# Patient Record
Sex: Male | Born: 1985 | Race: Black or African American | Hispanic: No | Marital: Married | Smoking: Current every day smoker
Health system: Southern US, Community
[De-identification: ages and names within clinical notes are randomized; demographics above are authoritative.]

## PROBLEM LIST (undated history)

## (undated) DIAGNOSIS — I10 Essential (primary) hypertension: Secondary | ICD-10-CM

## (undated) DIAGNOSIS — K649 Unspecified hemorrhoids: Secondary | ICD-10-CM

---
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
## 2008-09-21 ENCOUNTER — Ambulatory Visit: Payer: Self-pay | Admitting: Infectious Disease

## 2008-09-21 ENCOUNTER — Encounter (INDEPENDENT_AMBULATORY_CARE_PROVIDER_SITE_OTHER): Payer: Self-pay | Admitting: Internal Medicine

## 2008-09-21 DIAGNOSIS — J45909 Unspecified asthma, uncomplicated: Secondary | ICD-10-CM | POA: Insufficient documentation

## 2008-09-21 DIAGNOSIS — J309 Allergic rhinitis, unspecified: Secondary | ICD-10-CM | POA: Insufficient documentation

## 2008-11-05 ENCOUNTER — Ambulatory Visit: Payer: Self-pay | Admitting: Internal Medicine

## 2008-11-05 ENCOUNTER — Encounter: Payer: Self-pay | Admitting: Internal Medicine

## 2008-11-05 DIAGNOSIS — K029 Dental caries, unspecified: Secondary | ICD-10-CM | POA: Insufficient documentation

## 2008-11-05 LAB — CONVERTED CEMR LAB
Basophils Absolute: 0 10*3/uL (ref 0.0–0.1)
Basophils Relative: 0 % (ref 0–1)
Eosinophils Absolute: 0.4 10*3/uL (ref 0.0–0.7)
Eosinophils Relative: 5 % (ref 0–5)
HCT: 46.4 % (ref 39.0–52.0)
Hemoglobin: 15.9 g/dL (ref 13.0–17.0)
Lymphocytes Relative: 27 % (ref 12–46)
Lymphs Abs: 2.1 10*3/uL (ref 0.7–4.0)
MCHC: 34.3 g/dL (ref 30.0–36.0)
MCV: 91.3 fL (ref 78.0–100.0)
Monocytes Absolute: 0.9 10*3/uL (ref 0.1–1.0)
Monocytes Relative: 11 % (ref 3–12)
Neutro Abs: 4.5 10*3/uL (ref 1.7–7.7)
Neutrophils Relative %: 57 % (ref 43–77)
Platelets: 247 10*3/uL (ref 150–400)
RBC: 5.08 M/uL (ref 4.22–5.81)
RDW: 13.2 % (ref 11.5–15.5)
WBC: 7.9 10*3/uL (ref 4.0–10.5)

## 2011-04-07 ENCOUNTER — Emergency Department (HOSPITAL_COMMUNITY)
Admission: EM | Admit: 2011-04-07 | Discharge: 2011-04-07 | Disposition: A | Discharge status: Home / Self Care | Payer: Self-pay | Attending: Emergency Medicine | Admitting: Emergency Medicine

## 2011-04-07 ENCOUNTER — Encounter: Payer: Self-pay | Admitting: *Deleted

## 2011-04-07 DIAGNOSIS — K029 Dental caries, unspecified: Secondary | ICD-10-CM | POA: Insufficient documentation

## 2011-04-07 DIAGNOSIS — K089 Disorder of teeth and supporting structures, unspecified: Secondary | ICD-10-CM | POA: Insufficient documentation

## 2011-04-07 MED ORDER — PENICILLIN V POTASSIUM 500 MG PO TABS: 500.0000 mg | ORAL_TABLET | Freq: Four times a day (QID) | ORAL | Status: AC

## 2011-04-07 MED ORDER — HYDROCODONE-ACETAMINOPHEN 5-500 MG PO TABS: 1.0000 | ORAL_TABLET | Freq: Four times a day (QID) | ORAL | Status: AC | PRN

## 2011-04-07 NOTE — ED Notes (Signed)
Pt w/ lft lower toothache that began on Thursday, has medicated at home w/ ibuprofen and naproxen w/ minimal relief.

## 2011-04-07 NOTE — ED Provider Notes (Signed)
History     CSN: [id number] Arrival date & time: 04/07/2011  5:50 AM   First MD Initiated Contact with Patient 04/07/11 0557      No chief complaint on file.   (Consider location/radiation/quality/duration/timing/severity/associated sxs/prior treatment) Patient is a 25 y.o. male presenting with tooth pain. The history is provided by the patient. No language interpreter was used.  Dental PainThe primary symptoms include dental injury. Primary symptoms do not include mouth pain, oral bleeding, headaches, shortness of breath, sore throat, angioedema or cough. The symptoms began more than 1 week ago. The symptoms are worsening. The symptoms are new. The symptoms occur constantly.  Additional symptoms include: dental sensitivity to temperature. Additional symptoms do not include: gum swelling, gum tenderness, purulent gums, trismus, jaw pain, facial swelling, trouble swallowing, pain with swallowing, excessive salivation, dry mouth, taste disturbance, smell disturbance, drooling, ear pain, hearing loss, nosebleeds and swollen glands. Medical issues do not include: cancer.    No past medical history on file.  No past surgical history on file.  No family history on file.  History  Substance Use Topics  . Smoking status: Not on file  . Smokeless tobacco: Not on file  . Alcohol Use: Not on file      Review of Systems  HENT: Negative for hearing loss, ear pain, nosebleeds, sore throat, facial swelling, drooling and trouble swallowing.   Respiratory: Negative for cough and shortness of breath.   Cardiovascular: Negative for chest pain.  Gastrointestinal: Negative for abdominal distention.  Genitourinary: Negative for difficulty urinating.  Musculoskeletal: Negative.   Neurological: Negative for headaches.  Hematological: Negative.   Psychiatric/Behavioral: Negative.     Allergies  Review of patient's allergies indicates no known allergies.  Home Medications   Current Outpatient  Rx  Name Route Sig Dispense Refill  . IBUPROFEN 200 MG PO TABS Oral Take 200-800 mg by mouth every 6 (six) hours as needed. pain     . HYDROCODONE-ACETAMINOPHEN 5-500 MG PO TABS Oral Take 1 tablet by mouth every 6 (six) hours as needed for pain. 10 tablet 0  . PENICILLIN V POTASSIUM 500 MG PO TABS Oral Take 1 tablet (500 mg total) by mouth 4 (four) times daily. 40 tablet 0    There were no vitals taken for this visit.  Physical Exam  Constitutional: He is oriented to person, place, and time. He appears well-developed and well-nourished. No distress.  HENT:  Head: Normocephalic and atraumatic.  Mouth/Throat: Oropharynx is clear and moist.    Eyes: EOM are normal.  Neck: Normal range of motion. Neck supple.  Cardiovascular: Normal rate and regular rhythm.   Pulmonary/Chest: Effort normal and breath sounds normal.  Abdominal: Soft. Bowel sounds are normal.  Neurological: He is alert and oriented to person, place, and time.  Skin: Skin is warm and dry.  Psychiatric: He has a normal mood and affect.    ED Course  Procedures (including critical care time)  Labs Reviewed - No data to display No results found.   1. DENTAL CARIES       MDM  Take all antibiotics, follow up with dentristry for removal       Dolora Ridgely K Kavitha Lansdale-Rasch, MD 04/07/11 [phone]

## 2011-04-07 NOTE — ED Notes (Signed)
Rx given x1 D/c instructions reviewed w/ pt - pt denies any further questions or concerns at present.   

## 2011-04-07 NOTE — ED Notes (Signed)
Pt reports rt lower toothache that began on Thursday, denies fever.

## 2013-02-12 ENCOUNTER — Emergency Department (HOSPITAL_COMMUNITY)
Admission: EM | Admit: 2013-02-12 | Discharge: 2013-02-12 | Disposition: A | Discharge status: Home / Self Care | Payer: Self-pay | Attending: Emergency Medicine | Admitting: Emergency Medicine

## 2013-02-12 ENCOUNTER — Encounter (HOSPITAL_COMMUNITY): Payer: Self-pay | Admitting: *Deleted

## 2013-02-12 DIAGNOSIS — K0889 Other specified disorders of teeth and supporting structures: Secondary | ICD-10-CM

## 2013-02-12 DIAGNOSIS — F17200 Nicotine dependence, unspecified, uncomplicated: Secondary | ICD-10-CM | POA: Insufficient documentation

## 2013-02-12 DIAGNOSIS — K089 Disorder of teeth and supporting structures, unspecified: Secondary | ICD-10-CM | POA: Insufficient documentation

## 2013-02-12 DIAGNOSIS — Z79899 Other long term (current) drug therapy: Secondary | ICD-10-CM | POA: Insufficient documentation

## 2013-02-12 DIAGNOSIS — K029 Dental caries, unspecified: Secondary | ICD-10-CM | POA: Insufficient documentation

## 2013-02-12 DIAGNOSIS — K0381 Cracked tooth: Secondary | ICD-10-CM | POA: Insufficient documentation

## 2013-02-12 MED ORDER — PENICILLIN V POTASSIUM 250 MG PO TABS
500.0000 mg | ORAL_TABLET | Freq: Once | ORAL | Status: AC
  Administered 2013-02-12: 500 mg via ORAL
  Filled 2013-02-12: qty 2

## 2013-02-12 MED ORDER — TRAMADOL HCL 50 MG PO TABS
50.0000 mg | ORAL_TABLET | Freq: Once | ORAL | Status: AC
  Administered 2013-02-12: 50 mg via ORAL
  Filled 2013-02-12: qty 1

## 2013-02-12 MED ORDER — TRAMADOL HCL 50 MG PO TABS: 50.0000 mg | ORAL_TABLET | Freq: Four times a day (QID) | ORAL | Status: DC | PRN

## 2013-02-12 MED ORDER — PENICILLIN V POTASSIUM 500 MG PO TABS: 500.0000 mg | ORAL_TABLET | Freq: Four times a day (QID) | ORAL | Status: DC

## 2013-02-12 NOTE — ED Provider Notes (Signed)
CSN: [id number]     Arrival date & time 02/12/13  1804 History   First MD Initiated Contact with Patient 02/12/13 1853     Chief Complaint  Patient presents with  . Dental Pain   (Consider location/radiation/quality/duration/timing/severity/associated sxs/prior Treatment) HPI Comments: The patient is a 27 year old otherwise healthy male who presents with dental pain that started gradually one month ago. The dental pain is severe, constant and progressively worsening. The pain is aching and located in right upper jaw. The pain does not radiate. Eating makes the pain worse. Nothing makes the pain better. The patient has not tried anything for pain. No associated symptoms. Patient denies headache, neck pain/stiffness, fever, NVD, edema, sore throat, throat swelling, wheezing, SOB, chest pain, abdominal pain.      History reviewed. No pertinent past medical history. History reviewed. No pertinent past surgical history. History reviewed. No pertinent family history. History  Substance Use Topics  . Smoking status: Current Every Day Smoker -- 0.50 packs/day  . Smokeless tobacco: Not on file  . Alcohol Use: No    Review of Systems  HENT: Positive for dental problem.   All other systems reviewed and are negative.    Allergies  Review of patient's allergies indicates no known allergies.  Home Medications   Current Outpatient Rx  Name  Route  Sig  Dispense  Refill  . albuterol (PROVENTIL HFA;VENTOLIN HFA) 108 (90 BASE) MCG/ACT inhaler   Inhalation   Inhale 2 puffs into the lungs every 6 (six) hours as needed for wheezing.         Marland Kitchen ibuprofen (ADVIL,MOTRIN) 200 MG tablet   Oral   Take 200-800 mg by mouth every 6 (six) hours as needed. pain           BP 149/65  Pulse 70  Temp(Src) 98.4 F (36.9 C) (Oral)  Resp 16  SpO2 97% Physical Exam  Nursing note and vitals reviewed. Constitutional: He is oriented to person, place, and time. He appears well-developed and  well-nourished. No distress.  HENT:  Head: Normocephalic and atraumatic.  Poor dentition. Right upper molar tender to percussion. Multiple molars decayed and cracked.   Eyes: Conjunctivae are normal.  Neck: Normal range of motion.  Cardiovascular: Normal rate and regular rhythm.  Exam reveals no gallop and no friction rub.   No murmur heard. Pulmonary/Chest: Effort normal and breath sounds normal. He has no wheezes. He has no rales. He exhibits no tenderness.  Musculoskeletal: Normal range of motion.  Neurological: He is alert and oriented to person, place, and time. Coordination normal.  Speech is goal-oriented. Moves limbs without ataxia.   Skin: Skin is warm and dry.  Psychiatric: He has a normal mood and affect. His behavior is normal.    ED Course  Procedures (including critical care time) Labs Review Labs Reviewed - No data to display Imaging Review No results found.  MDM   1. Pain, dental     7:49 PM Patient given Veetid and Tramadol. Patient will have prescriptions for both. Patient instructed to follow up with [facility] in [place]. Vitals stable and patient afebrile.     Emilia Beck, PA-C 02/12/13 2003

## 2013-02-12 NOTE — ED Notes (Signed)
Reports right upper dental pain for several days. Airway intact. 

## 2013-02-19 NOTE — ED Provider Notes (Signed)
Medical screening examination/treatment/procedure(s) were performed by non-physician practitioner and as supervising physician I was immediately available for consultation/collaboration.   Roney Marion, MD 02/19/13 1626

## 2014-10-10 ENCOUNTER — Emergency Department (HOSPITAL_COMMUNITY)
Admission: EM | Admit: 2014-10-10 | Discharge: 2014-10-10 | Disposition: A | Discharge status: Home / Self Care | Payer: Self-pay | Attending: Emergency Medicine | Admitting: Emergency Medicine

## 2014-10-10 ENCOUNTER — Encounter (HOSPITAL_COMMUNITY): Payer: Self-pay | Admitting: Emergency Medicine

## 2014-10-10 DIAGNOSIS — R5383 Other fatigue: Secondary | ICD-10-CM | POA: Insufficient documentation

## 2014-10-10 DIAGNOSIS — R252 Cramp and spasm: Secondary | ICD-10-CM

## 2014-10-10 DIAGNOSIS — Z792 Long term (current) use of antibiotics: Secondary | ICD-10-CM | POA: Insufficient documentation

## 2014-10-10 DIAGNOSIS — Z720 Tobacco use: Secondary | ICD-10-CM | POA: Insufficient documentation

## 2014-10-10 DIAGNOSIS — M62838 Other muscle spasm: Secondary | ICD-10-CM | POA: Insufficient documentation

## 2014-10-10 LAB — I-STAT CHEM 8, ED
BUN: 17 mg/dL (ref 6–20)
CHLORIDE: 102 mmol/L (ref 101–111)
CREATININE: 1 mg/dL (ref 0.61–1.24)
Calcium, Ion: 1.24 mmol/L — ABNORMAL HIGH (ref 1.12–1.23)
Glucose, Bld: 90 mg/dL (ref 65–99)
HEMATOCRIT: 50 % (ref 39.0–52.0)
Hemoglobin: 17 g/dL (ref 13.0–17.0)
POTASSIUM: 3.6 mmol/L (ref 3.5–5.1)
Sodium: 141 mmol/L (ref 135–145)
TCO2: 24 mmol/L (ref 0–100)

## 2014-10-10 MED ORDER — CYCLOBENZAPRINE HCL 10 MG PO TABS: 10.0000 mg | ORAL_TABLET | Freq: Three times a day (TID) | ORAL | Status: DC | PRN

## 2014-10-10 NOTE — ED Notes (Signed)
Pt c/o B/L leg spasms x 4 days, pt also reports spasms noted under his eyes. When he walks his feet begin to cramp up too.

## 2014-10-10 NOTE — Discharge Instructions (Signed)
Take naprosyn as directed for inflammation and pain and flexeril for muscle relaxation at night. Do not drive or operate machinery with muscle relaxation use. Use heat to the areas of pain, no more than 20 minutes at a time every hour. Stretch your muscles out when having the spasms. Follow up with [facility] in 1 week for recheck of symptoms. Return to ER for emergent changing or worsening of symptoms.     Leg Cramps Leg cramps that occur during exercise can be caused by poor circulation or dehydration. However, muscle cramps that occur at rest or during the night are usually not due to any serious medical problem. Heat cramps may cause muscle spasms during hot weather.  CAUSES There is no clear cause for muscle cramps. However, dehydration may be a factor for those who do not drink enough fluids and those who exercise in the heat. Imbalances in the level of sodium, potassium, calcium or magnesium in the muscle tissue may also be a factor. Some medications, such as water pills (diuretics), may cause loss of chemicals that the body needs (like sodium and potassium) and cause muscle cramps. TREATMENT   Make sure your diet has enough fluids and essential minerals for the muscle to work normally.  Avoid strenuous exercise for several days if you have been having frequent leg cramps.  Stretch and massage the cramped muscle for several minutes.  Some medicines may be helpful in some patients with night cramps. Only take over-the-counter or prescription medicines as directed by your caregiver. SEEK IMMEDIATE MEDICAL CARE IF:   Your leg cramps become worse.  Your foot becomes cold, numb, or blue. Document Released: 06/14/2004 Document Revised: 07/30/2011 Document Reviewed: 06/01/2008 [facility] Patient Information 2015 [place], [place]. This information is not intended to replace advice given to you by your health care provider. Make sure you discuss any questions you have with your health  care provider.  Muscle Cramps and Spasms Muscle cramps and spasms occur when a muscle or muscles tighten and you have no control over this tightening (involuntary muscle contraction). They are a common problem and can develop in any muscle. The most common place is in the calf muscles of the leg. Both muscle cramps and muscle spasms are involuntary muscle contractions, but they also have differences:   Muscle cramps are sporadic and painful. They may last a few seconds to a quarter of an hour. Muscle cramps are often more forceful and last longer than muscle spasms.  Muscle spasms may or may not be painful. They may also last just a few seconds or much longer. CAUSES  It is uncommon for cramps or spasms to be due to a serious underlying problem. In many cases, the cause of cramps or spasms is unknown. Some common causes are:   Overexertion.   Overuse from repetitive motions (doing the same thing over and over).   Remaining in a certain position for a long period of time.   Improper preparation, form, or technique while performing a sport or activity.   Dehydration.   Injury.   Side effects of some medicines.   Abnormally low levels of the salts and ions in your blood (electrolytes), especially potassium and calcium. This could happen if you are taking water pills (diuretics) or you are pregnant.  Some underlying medical problems can make it more likely to develop cramps or spasms. These include, but are not limited to:   Diabetes.   Parkinson disease.   Hormone disorders, such as  thyroid problems.   Alcohol abuse.   Diseases specific to muscles, joints, and bones.   Blood vessel disease where not enough blood is getting to the muscles.  HOME CARE INSTRUCTIONS   Stay well hydrated. Drink enough water and fluids to keep your urine clear or pale yellow.  It may be helpful to massage, stretch, and relax the affected muscle.  For tight or tense muscles, use a  warm towel, heating pad, or hot shower water directed to the affected area.  If you are sore or have pain after a cramp or spasm, applying ice to the affected area may relieve discomfort.  Put ice in a plastic bag.  Place a towel between your skin and the bag.  Leave the ice on for 15-20 minutes, 03-04 times a day.  Medicines used to treat a known cause of cramps or spasms may help reduce their frequency or severity. Only take over-the-counter or prescription medicines as directed by your caregiver. SEEK MEDICAL CARE IF:  Your cramps or spasms get more severe, more frequent, or do not improve over time.  MAKE SURE YOU:   Understand these instructions.  Will watch your condition.  Will get help right away if you are not doing well or get worse. Document Released: 10/27/2001 Document Revised: 09/01/2012 Document Reviewed: 04/23/2012 [facility] Patient Information 2015 [place]. This information is not intended to replace advice given to you by your health care provider. Make sure you discuss any questions you have with your health care provider.

## 2014-10-10 NOTE — ED Notes (Signed)
Declined W/C at D/C and was escorted to lobby by RN. 

## 2014-10-10 NOTE — ED Provider Notes (Signed)
CSN: [id number]     Arrival date & time 10/10/14  1559 History  This chart was scribed for non-physician practitioner working, [employer], PA-C, with Glynn Octave, MD, by Modena Jansky, ED Scribe. This patient was seen in room TR10C/TR10C and the patient's care was started at 4:51 PM.   Chief Complaint  Patient presents with  . Spasms   Patient is a 29 y.o. male presenting with general illness. The history is provided by the patient. No language interpreter was used.  Illness Location:  BLE, feet, and eyelids Quality:  Cramping Severity:  Moderate Onset quality:  Gradual Duration:  5 days Timing:  Intermittent Progression:  Unchanged Chronicity:  New Context:  Spasms  Relieved by:  None  Worsened by:  None Ineffective treatments:  None  Associated symptoms: fatigue   Associated symptoms: no abdominal pain, no chest pain, no cough, no fever, no myalgias, no nausea, no rhinorrhea, no shortness of breath, no sore throat and no vomiting     HPI Comments: Clayton Rodgers is a 29 y.o. healthy male who presents to the Emergency Department complaining of intermittent moderate spasms that started 4 days ago. He reports that he has been having spasms and cramps to his bilateral feet, bilateral lower legs, and his lower eyelids. He states that he has been having overall fatigue. He reports no modifying factors. He denies any lightheadedness, leg pain, leg swelling, abdominal pain, nausea, vomiting, chest pain, SOB, sore throat, cough, URI symptoms, tingling, weakness, numbness, or back pain. No recent trauma. No claudication.  History reviewed. No pertinent past medical history. History reviewed. No pertinent past surgical history. No family history on file. History  Substance Use Topics  . Smoking status: Current Every Day Smoker -- 0.50 packs/day  . Smokeless tobacco: Not on file  . Alcohol Use: No    Review of Systems  Constitutional: Positive for fatigue. Negative for  fever and chills.  HENT: Negative for rhinorrhea and sore throat.   Respiratory: Negative for cough and shortness of breath.   Cardiovascular: Negative for chest pain and leg swelling.  Gastrointestinal: Negative for nausea, vomiting and abdominal pain.  Genitourinary: Negative for dysuria and hematuria.  Musculoskeletal: Negative for myalgias, back pain, joint swelling and arthralgias.       Leg cramps  Skin: Negative for color change.  Allergic/Immunologic: Negative for immunocompromised state.  Neurological: Negative for weakness, light-headedness and numbness.   10 Systems reviewed and all are negative for acute change except as noted in the HPI.  Allergies  Review of patient's allergies indicates no known allergies.  Home Medications   Prior to Admission medications   Medication Sig Start Date End Date Taking? Authorizing Provider  albuterol (PROVENTIL HFA;VENTOLIN HFA) 108 (90 BASE) MCG/ACT inhaler Inhale 2 puffs into the lungs every 6 (six) hours as needed for wheezing.    Historical Provider, MD  ibuprofen (ADVIL,MOTRIN) 200 MG tablet Take 200-800 mg by mouth every 6 (six) hours as needed. pain     Historical Provider, MD  penicillin v potassium (VEETID) 500 MG tablet Take 1 tablet (500 mg total) by mouth 4 (four) times daily. 02/12/13   Kaitlyn Szekalski, PA-C  traMADol (ULTRAM) 50 MG tablet Take 1 tablet (50 mg total) by mouth every 6 (six) hours as needed for pain. 02/12/13   Kaitlyn Szekalski, PA-C   BP 142/82 mmHg  Pulse 68  Temp(Src) 98 F (36.7 C) (Oral)  Resp 18  Ht  (1.676 m)  Wt 221 lb 6.4  oz (100.426 kg)  BMI 35.75 kg/m2  SpO2 98% Physical Exam  Constitutional: He is oriented to person, place, and time. Vital signs are normal. He appears well-developed and well-nourished.  Non-toxic appearance. No distress.  Afebrile, nontoxic, NAD  HENT:  Head: Normocephalic and atraumatic.  Mouth/Throat: Mucous membranes are normal.  Eyes: Conjunctivae and EOM are  normal. Right eye exhibits no discharge. Left eye exhibits no discharge.  Neck: Normal range of motion. Neck supple.  Cardiovascular: Normal rate and intact distal pulses.   Pulmonary/Chest: Effort normal. No respiratory distress.  Abdominal: Normal appearance. He exhibits no distension.  Musculoskeletal: Normal range of motion.       Right lower leg: Normal.       Left lower leg: Normal.  MAE x4 Strength and sensation grossly intact Distal pulses intact No pedal edema, neg homan's bilaterally No focal calf or leg TTP, no swelling or deformity, ROM intact in al joints, gait steady.   Neurological: He is alert and oriented to person, place, and time. He has normal strength. No sensory deficit.  Skin: Skin is warm, dry and intact. No rash noted. No erythema.  No erythema or warmth of legs  Psychiatric: He has a normal mood and affect.  Nursing note and vitals reviewed.   ED Course  Procedures (including critical care time) DIAGNOSTIC STUDIES: Oxygen Saturation is 98% on RA, normal by my interpretation.    COORDINATION OF CARE: 4:55 PM- Pt advised of plan for treatment which includes labs and pt agrees.  Labs Review Labs Reviewed  I-STAT CHEM 8, ED - Abnormal; Notable for the following:    Calcium, Ion 1.24 (*)    All other components within normal limits    Imaging Review No results found.   EKG Interpretation None      MDM   Final diagnoses:  Spasm     29 y.o. male here with intermittent leg cramps, no claudication, neurovascularly intact with soft compartments. Neg homan's, no hypoxia or tachycardia, no risk factors for DVT. Chem 8 checked to eval for lyte abnormalities, which was WNL. Will have him take flexeril as needed for spasms at night, and [facility]. I explained the diagnosis and have given explicit precautions to return to the ER including for any other new or worsening symptoms. The patient understands and accepts the medical plan as  it's been dictated and I have answered their questions. Discharge instructions concerning home care and prescriptions have been given. The patient is STABLE and is discharged to home in good condition.   I personally performed the services described in this documentation, which was scribed in my presence. The recorded information has been reviewed and is accurate.  BP 142/82 mmHg  Pulse 68  Temp(Src) 98 F (36.7 C) (Oral)  Resp 18  Ht 5\' 6"  (1.676 m)  Wt 221 lb 6.4 oz (100.426 kg)  BMI 35.75 kg/m2  SpO2 98%  Meds ordered this encounter  Medications  . cyclobenzaprine (FLEXERIL) 10 MG tablet    Sig: Take 1 tablet (10 mg total) by mouth 3 (three) times daily as needed for muscle spasms.    Dispense:  15 tablet    Refill:  0    Order Specific Question:  Supervising Provider    Answer:  Eber HongMILLER, BRIAN [[id number]]     Clayton Genova Camprubi-Soms, PA-C 10/10/14 1802  Glynn OctaveStephen Rancour, MD 10/11/14 0130

## 2014-10-26 ENCOUNTER — Emergency Department (HOSPITAL_COMMUNITY)
Admission: EM | Admit: 2014-10-26 | Discharge: 2014-10-26 | Disposition: A | Discharge status: Home / Self Care | Payer: Self-pay | Attending: Emergency Medicine | Admitting: Emergency Medicine

## 2014-10-26 ENCOUNTER — Encounter (HOSPITAL_COMMUNITY): Payer: Self-pay | Admitting: Physical Medicine and Rehabilitation

## 2014-10-26 DIAGNOSIS — K648 Other hemorrhoids: Secondary | ICD-10-CM | POA: Insufficient documentation

## 2014-10-26 DIAGNOSIS — Z79899 Other long term (current) drug therapy: Secondary | ICD-10-CM | POA: Insufficient documentation

## 2014-10-26 DIAGNOSIS — K649 Unspecified hemorrhoids: Secondary | ICD-10-CM

## 2014-10-26 DIAGNOSIS — Z720 Tobacco use: Secondary | ICD-10-CM | POA: Insufficient documentation

## 2014-10-26 HISTORY — DX: Unspecified hemorrhoids: K64.9

## 2014-10-26 MED ORDER — PRAMOXINE HCL 1 % RE FOAM: 1.0000 "application " | Freq: Three times a day (TID) | RECTAL | Status: DC | PRN

## 2014-10-26 NOTE — ED Notes (Signed)
Pt reports issues with hemorrhoids for several years. Pain increased today.

## 2014-10-26 NOTE — ED Provider Notes (Signed)
CSN: [id number]     Arrival date & time 10/26/14  0840 History  This chart was scribed for non-physician practitioner Francee Piccolo, PA-C working with Tilden Fossa, MD by Lyndel Safe, ED Scribe. This patient was seen in room TR11C/TR11C and the patient's care was started at 9:19 AM.    Chief Complaint  Patient presents with  . Hemorrhoids   The history is provided by the patient. No language interpreter was used.   HPI Comments: Clayton Rodgers is a 29 y.o. male who presents to the Emergency Department complaining of  constant, progressively worsening rectal pain onset 3 days ago. He states this pain has been intermittent for the past 2 years. He associates the pain is exacerbated in the morning with soft BMs. 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 Pt reports subjective hemorrhoids but has never been diagnosed with hemorrhoids by a physician. For previous similar symptoms he has used Preparation H cream with relief but he denies this treatment to providing relief for today's complaint. Pt states he has to use wet wipes when he uses the bathroom. He denies constipation, diarrhea, blood in stool, fevers, vomiting, recent antibiotic use, recent travels, or abdominal surgery.   Past Medical History  Diagnosis Date  . Hemorrhoids    History reviewed. No pertinent past surgical history. History reviewed. No pertinent family history. History  Substance Use Topics  . Smoking status: Current Every Day Smoker -- 0.50 packs/day  . Smokeless tobacco: Not on file  . Alcohol Use: No    Review of Systems  Constitutional: Negative for fever.  Gastrointestinal: Negative for vomiting, diarrhea, constipation and blood in stool.  All other systems reviewed and are negative.     Allergies  Review of patient's allergies indicates no known allergies.  Home Medications   Prior to Admission medications   Medication Sig Start Date End Date Taking?  Authorizing Provider  albuterol (PROVENTIL HFA;VENTOLIN HFA) 108 (90 BASE) MCG/ACT inhaler Inhale 2 puffs into the lungs every 6 (six) hours as needed for wheezing.    Historical Provider, MD  cyclobenzaprine (FLEXERIL) 10 MG tablet Take 1 tablet (10 mg total) by mouth 3 (three) times daily as needed for muscle spasms. 10/10/14   Mercedes Camprubi-Soms, PA-C  ibuprofen (ADVIL,MOTRIN) 200 MG tablet Take 200-800 mg by mouth every 6 (six) hours as needed. pain     Historical Provider, MD  penicillin v potassium (VEETID) 500 MG tablet Take 1 tablet (500 mg total) by mouth 4 (four) times daily. 02/12/13   Kaitlyn Szekalski, PA-C  pramoxine (PROCTOFOAM) 1 % foam Place 1 application rectally 3 (three) times daily as needed for itching. 10/26/14   Jacklyne Baik, PA-C  traMADol (ULTRAM) 50 MG tablet Take 1 tablet (50 mg total) by mouth every 6 (six) hours as needed for pain. 02/12/13   Kaitlyn Szekalski, PA-C   BP 131/76 mmHg  Pulse 82  Temp(Src) 97.4 F (36.3 C) (Oral)  Resp 20  Wt 215 lb (97.523 kg)  SpO2 97% Physical Exam  Constitutional: He is oriented to person, place, and time. He appears well-developed and well-nourished. No distress.  HENT:  Head: Normocephalic.  Right Ear: External ear normal.  Left Ear: External ear normal.  Mouth/Throat: No oropharyngeal exudate.  Eyes: Pupils are equal, round, and reactive to light. Right eye exhibits no discharge. Left eye exhibits no discharge. No scleral icterus.  Neck: No JVD present.  Cardiovascular: Normal rate, regular rhythm and normal heart sounds.  Pulmonary/Chest: Effort normal and breath sounds normal. No respiratory distress.  Genitourinary: Rectal exam shows internal hemorrhoid. Rectal exam shows no external hemorrhoid, no mass and anal tone normal.  Musculoskeletal: He exhibits no edema.  Lymphadenopathy:    He has no cervical adenopathy.  Neurological: He is alert and oriented to person, place, and time.  Skin: Skin is warm. No  rash noted. No erythema. No pallor.  Psychiatric: He has a normal mood and affect. His behavior is normal.  Nursing note and vitals reviewed.   ED Course  Procedures  Medications - No data to display  DIAGNOSTIC STUDIES: Oxygen Saturation is 97% on RA, normal by my interpretation.    COORDINATION OF CARE: 9:24 AM Discussed treatment plan which includes rectal exam. Treatment plan to include a high fiber diet and follow up with PCP if symptoms persist. Pt acknowledges and agrees to plan.   Labs Review Labs Reviewed - No data to display  Imaging Review No results found.   EKG Interpretation None      MDM   Final diagnoses:  Hemorrhoids, unspecified hemorrhoid type    Filed Vitals:   10/26/14 0843  BP: 131/76  Pulse: 82  Temp: 97.4 F (36.3 C)  Resp: 20   Afebrile, NAD, non-toxic appearing, AAOx4. Patient presenting with hemorrhoid pain exacerbation. On GU exam he has a small internal hemorrhoid noted. No bright red blood on examination. Will prescribe proctoscopy form. Symptomatically measures discussed advised PCP follow-up for continued symptoms. Patient is stable at time of discharge      I personally performed the services described in this documentation, which was scribed in my presence. The recorded information has been reviewed and is accurate.     Francee PiccoloJennifer Catori Panozzo, PA-C 10/26/14 1019  Tilden FossaElizabeth Rees, MD 10/26/14 1410

## 2014-10-26 NOTE — Discharge Instructions (Signed)
Please follow up with your primary care physician in 1-2 days. If you do not have one please call the [facility] number listed above. Please read all discharge instructions and return precautions.    Hemorrhoids Hemorrhoids are swollen veins around the rectum or anus. There are two types of hemorrhoids:   Internal hemorrhoids. These occur in the veins just inside the rectum. They may poke through to the outside and become irritated and painful.  External hemorrhoids. These occur in the veins outside the anus and can be felt as a painful swelling or hard lump near the anus. CAUSES  Pregnancy.   Obesity.   Constipation or diarrhea.   Straining to have a bowel movement.   Sitting for long periods on the toilet.  Heavy lifting or other activity that caused you to strain.  Anal intercourse. SYMPTOMS   Pain.   Anal itching or irritation.   Rectal bleeding.   Fecal leakage.   Anal swelling.   One or more lumps around the anus.  DIAGNOSIS  Your caregiver may be able to diagnose hemorrhoids by visual examination. Other examinations or tests that may be performed include:   Examination of the rectal area with a gloved hand (digital rectal exam).   Examination of anal canal using a small tube (scope).   A blood test if you have lost a significant amount of blood.  A test to look inside the colon (sigmoidoscopy or colonoscopy). TREATMENT Most hemorrhoids can be treated at home. However, if symptoms do not seem to be getting better or if you have a lot of rectal bleeding, your caregiver may perform a procedure to help make the hemorrhoids get smaller or remove them completely. Possible treatments include:   Placing a rubber band at the base of the hemorrhoid to cut off the circulation (rubber band ligation).   Injecting a chemical to shrink the hemorrhoid (sclerotherapy).   Using a tool to burn the hemorrhoid (infrared light therapy).    Surgically removing the hemorrhoid (hemorrhoidectomy).   Stapling the hemorrhoid to block blood flow to the tissue (hemorrhoid stapling).  HOME CARE INSTRUCTIONS   Eat foods with fiber, such as whole grains, beans, nuts, fruits, and vegetables. Ask your doctor about taking products with added fiber in them (fibersupplements).  Increase fluid intake. Drink enough water and fluids to keep your urine clear or pale yellow.   Exercise regularly.   Go to the bathroom when you have the urge to have a bowel movement. Do not wait.   Avoid straining to have bowel movements.   Keep the anal area dry and clean. Use wet toilet paper or moist towelettes after a bowel movement.   Medicated creams and suppositories may be used or applied as directed.   Only take over-the-counter or prescription medicines as directed by your caregiver.   Take warm sitz baths for 15-20 minutes, 3-4 times a day to ease pain and discomfort.   Place ice packs on the hemorrhoids if they are tender and swollen. Using ice packs between sitz baths may be helpful.   Put ice in a plastic bag.   Place a towel between your skin and the bag.   Leave the ice on for 15-20 minutes, 3-4 times a day.   Do not use a donut-shaped pillow or sit on the toilet for long periods. This increases blood pooling and pain.  SEEK MEDICAL CARE IF:  You have increasing pain and swelling that is not controlled by treatment  or medicine.  You have uncontrolled bleeding.  You have difficulty or you are unable to have a bowel movement.  You have pain or inflammation outside the area of the hemorrhoids. MAKE SURE YOU:  Understand these instructions.  Will watch your condition.  Will get help right away if you are not doing well or get worse. Document Released: 05/04/2000 Document Revised: 04/23/2012 Document Reviewed: 03/11/2012 [facility] Patient Information 2015 [place], [place]. This information is not intended to  replace advice given to you by your health care provider. Make sure you discuss any questions you have with your health care provider.

## 2015-04-03 ENCOUNTER — Emergency Department (HOSPITAL_COMMUNITY)
Admission: EM | Admit: 2015-04-03 | Discharge: 2015-04-03 | Disposition: A | Discharge status: Home / Self Care | Payer: Self-pay | Attending: Physician Assistant | Admitting: Physician Assistant

## 2015-04-03 ENCOUNTER — Encounter (HOSPITAL_COMMUNITY): Payer: Self-pay | Admitting: Emergency Medicine

## 2015-04-03 DIAGNOSIS — Z792 Long term (current) use of antibiotics: Secondary | ICD-10-CM | POA: Insufficient documentation

## 2015-04-03 DIAGNOSIS — M545 Low back pain, unspecified: Secondary | ICD-10-CM

## 2015-04-03 DIAGNOSIS — R252 Cramp and spasm: Secondary | ICD-10-CM

## 2015-04-03 DIAGNOSIS — F17200 Nicotine dependence, unspecified, uncomplicated: Secondary | ICD-10-CM | POA: Insufficient documentation

## 2015-04-03 DIAGNOSIS — M62830 Muscle spasm of back: Secondary | ICD-10-CM | POA: Insufficient documentation

## 2015-04-03 DIAGNOSIS — Z79899 Other long term (current) drug therapy: Secondary | ICD-10-CM | POA: Insufficient documentation

## 2015-04-03 LAB — I-STAT CHEM 8, ED
BUN: 20 mg/dL (ref 6–20)
Calcium, Ion: 1.01 mmol/L — ABNORMAL LOW (ref 1.12–1.23)
Chloride: 107 mmol/L (ref 101–111)
Creatinine, Ser: 0.9 mg/dL (ref 0.61–1.24)
Glucose, Bld: 97 mg/dL (ref 65–99)
HCT: 54 % — ABNORMAL HIGH (ref 39.0–52.0)
Hemoglobin: 18.4 g/dL — ABNORMAL HIGH (ref 13.0–17.0)
Potassium: 3.9 mmol/L (ref 3.5–5.1)
Sodium: 139 mmol/L (ref 135–145)
TCO2: 21 mmol/L (ref 0–100)

## 2015-04-03 MED ORDER — NAPROXEN 500 MG PO TABS: 500.0000 mg | ORAL_TABLET | Freq: Two times a day (BID) | ORAL | Status: DC | PRN

## 2015-04-03 NOTE — ED Provider Notes (Signed)
CSN: [id number]     Arrival date & time 04/03/15  1353 History  By signing my name below, I, Clayton Rodgers, attest that this documentation has been prepared under the direction and in the presence of Clayton Rodgers. Electronically Signed: [name] ED Scribe. 04/03/2015. 2:26 PM.    Chief Complaint  Patient presents with  . Back Pain  . Spasms   Patient is a 29 y.o. male presenting with back pain. The history is provided by the patient. No language interpreter was used.  Back Pain Location:  Lumbar spine Quality: sore. Radiates to:  Does not radiate Pain severity:  Moderate (7/10) Pain is:  Same all the time Onset quality:  Gradual Duration:  1 day Timing:  Constant Progression:  Unchanged Chronicity:  New Context: recent injury   Context comment:  Bending over while seated Relieved by:  NSAIDs (Aleve) Worsened by:  Palpation Ineffective treatments:  None tried Associated symptoms: no abdominal pain, no bladder incontinence, no bowel incontinence, no chest pain, no dysuria, no fever, no numbness, no paresthesias, no perianal numbness, no tingling and no weakness    HPI Comments: Clayton Rodgers is a 29 y.o. male with a PMHx of leg cramps/spasms, who presents to the Emergency Department complaining of nonpainful, intermittent, cramping/spasms most pronounced in the bilateral feet and but also throughout the BLE's onset two weeks ago; trasiently relieved by Epsom Salts and Aleve; worse while at work while wearing steel-toed boots. Works as a Production designer, theatre/television/film, on his feet a lot at work. He has had these symptoms before, was seen in May 2016 for same thing and had neg lab work at that time, was given flexeril which helped but it made him feel very sleepy so he couldn't use it at work.  He states it often occurs when walking that he will begin to develop a cramp.  He denies hx of DVT/PE.  He denies recent travel/surgery or prolonged immobilization.    He also complains of  7/10 intermittent, sore, non-radiating lower back pain onset yesterday after bending down while sitting on the couch, aggravated with walking/movements, and improved with flexeril/aleve.   He denies any fever, chills, CP, SOB, n/v/d/c, abdominal pain, flank pain, dysuria, hematuria, numbness, tingling, weakness, bowel/bladder incontinence, BLE redness/warmth, or LE swelling.     Past Medical History  Diagnosis Date  . Hemorrhoids    History reviewed. No pertinent past surgical history. No family history on file. Social History  Substance Use Topics  . Smoking status: Current Every Day Smoker -- 0.50 packs/day  . Smokeless tobacco: None  . Alcohol Use: No    Review of Systems  Constitutional: Negative for fever and chills.  Respiratory: Negative for shortness of breath.   Cardiovascular: Negative for chest pain and leg swelling.  Gastrointestinal: Negative for nausea, vomiting, abdominal pain, diarrhea, constipation and bowel incontinence.  Genitourinary: Negative for bladder incontinence, dysuria, hematuria and flank pain.  Musculoskeletal: Positive for myalgias (BLE spasm/cramps) and back pain.  Skin: Negative for color change.  Allergic/Immunologic: Negative for immunocompromised state.  Neurological: Negative for tingling, weakness, numbness and paresthesias.  10 Systems reviewed and are negative for acute change except as noted in the HPI.     Allergies  Review of patient's allergies indicates no known allergies.  Home Medications   Prior to Admission medications   Medication Sig Start Date End Date Taking? Authorizing Provider  albuterol (PROVENTIL HFA;VENTOLIN HFA) 108 (90 BASE) MCG/ACT inhaler Inhale 2 puffs into the lungs every 6 (  six) hours as needed for wheezing.    Historical Provider, MD  cyclobenzaprine (FLEXERIL) 10 MG tablet Take 1 tablet (10 mg total) by mouth 3 (three) times daily as needed for muscle spasms. 10/10/14   Omarian Jaquith Camprubi-Soms, PA-C  ibuprofen  (ADVIL,MOTRIN) 200 MG tablet Take 200-800 mg by mouth every 6 (six) hours as needed. pain     Historical Provider, MD  penicillin v potassium (VEETID) 500 MG tablet Take 1 tablet (500 mg total) by mouth 4 (four) times daily. 02/12/13   Kaitlyn Szekalski, PA-C  pramoxine (PROCTOFOAM) 1 % foam Place 1 application rectally 3 (three) times daily as needed for itching. 10/26/14   Jennifer Piepenbrink, PA-C  traMADol (ULTRAM) 50 MG tablet Take 1 tablet (50 mg total) by mouth every 6 (six) hours as needed for pain. 02/12/13   Kaitlyn Szekalski, PA-C   BP 136/90 mmHg  Pulse 81  Temp(Src) 99.1 F (37.3 C) (Oral)  Resp 18  Ht  (1.676 m)  Wt 226 lb 5 oz (102.655 kg)  BMI 36.55 kg/m2  SpO2 97% Physical Exam  Constitutional: He is oriented to person, place, and time. Vital signs are normal. He appears well-developed and well-nourished.  Non-toxic appearance. No distress.  Afebrile, nontoxic, NAD  HENT:  Head: Normocephalic and atraumatic.  Mouth/Throat: Mucous membranes are normal.  Eyes: Conjunctivae and EOM are normal. Right eye exhibits no discharge. Left eye exhibits no discharge.  Neck: Normal range of motion. Neck supple.  Cardiovascular: Normal rate and intact distal pulses.   Pulmonary/Chest: Effort normal. No respiratory distress.  Abdominal: Normal appearance. He exhibits no distension.  Musculoskeletal: Normal range of motion.       Lumbar back: He exhibits tenderness and spasm. He exhibits normal range of motion, no bony tenderness and no deformity.       Back:       Right lower leg: Normal.       Left lower leg: Normal.  Lumbar spine with FROM intact without spinous process TTP, no bony stepoffs or deformities, with  Mild left-sided paraspinous muscle TTP and muscle spasms. Strength 5/5 in all extremities, sensation grossly intact in all extremities, negative SLR bilaterally, gait steady and nonantalgic. No overlying skin changes.  MAE x4 Strength and sensation grossly  intact Distal pulses intact No pedal edema, neg homan's bilaterally No calf tenderness, spasm, or skin changes/discoloration/warmth  Neurological: He is alert and oriented to person, place, and time. He has normal strength. No sensory deficit.  Skin: Skin is warm, dry and intact. No rash noted.  Psychiatric: He has a normal mood and affect.  Nursing note and vitals reviewed.   ED Course  Procedures (including critical care time)  DIAGNOSTIC STUDIES: Oxygen Saturation is 97% on RA, normal by my interpretation.    COORDINATION OF CARE:  2:33 PM Discussed plan to order labs.  Patient acknowledges and agrees with plan.    Labs Review Labs Reviewed  I-STAT CHEM 8, ED - Abnormal; Notable for the following:    Calcium, Ion 1.01 (*)    Hemoglobin 18.4 (*)    HCT 54.0 (*)    All other components within normal limits    Imaging Review No results found. I have personally reviewed and evaluated these images and lab results as part of my medical decision-making.   EKG Interpretation None      MDM   Final diagnoses:  Cramp and spasm  Left-sided low back pain without sciatica  Back muscle spasm    29  y.o. male here with recurrent leg/foot cramps, and some mild low back pain after bending over. Was seen in the past by me for leg/foot cramps, chem 8 at that time was unremarkable, and he was sent home with flexeril which helped. Agreed to recheck chem 8 to check for electrolyte abnormalities but if these are again unremarkable then will likely need to have neurology f/up to see if he has some undiagnosed neuromuscular spasticity disorder. No s/sx or risk factors for DVT, no LE swelling, legs NVI with soft compartments therefore doubt arterial compromise. Could be from his job (works in [employer] to[employer], on his feet all day) causing stress/overuse of muscles.  As for his back pain, No red flag s/s of low back pain. No s/s of central cord compression or cauda equina. Lower extremities are  neurovascularly intact and patient is ambulating without difficulty. Mild tenderness and spasm to L sided paraspinous muscles. Doubt need for imaging. No urinary complaints. Likely just a muscle strain. Pt declines pain meds or muscle relaxer here. Will reassess after chem8.  3:13 PM Labs unremarkable. Regarding the spasms, discussed use of shoe inserts to help with muscle fatigue, heat therapy, and staying hydrated. F/up with [facility] in 1-2 wks for ongoing eval and to establish medical care. If symptoms persist, consider neurology f/up for ongoing investigation.   For back pain, Patient was counseled on back pain precautions and told to do activity as tolerated but do not lift, push, or pull heavy objects more than 10 pounds for the next week. Patient counseled to use ice or heat on back for no longer than 15 minutes every hour. Rx for naprosyn given. Pt still has rx of flexeril. Patient urged to follow-up with [facility] if pain does not improve with treatment and rest or if pain becomes recurrent. Urged to return with worsening severe pain, loss of bowel or bladder control, trouble walking. The patient verbalizes understanding and agrees with the plan.   I personally performed the services described in this documentation, which was scribed in my presence. The recorded information has been reviewed and is accurate.    BP 136/90 mmHg  Pulse 81  Temp(Src) 99.1 F (37.3 C) (Oral)  Resp 18  Ht 5\' 6"  (1.676 m)  Wt 226 lb 5 oz (102.655 kg)  BMI 36.55 kg/m2  SpO2 97%  Meds ordered this encounter  Medications  . naproxen (NAPROSYN) 500 MG tablet    Sig: Take 1 tablet (500 mg total) by mouth 2 (two) times daily as needed for mild pain or moderate pain (TAKE WITH MEALS.).    Dispense:  20 tablet    Refill:  0    Order Specific Question:  Supervising Provider    Answer:  Eber HongMILLER, BRIAN [[id number]]     Raegyn Renda Camprubi-Soms, PA-C 04/03/15 1514  Courteney Randall AnLyn Mackuen, MD 04/03/15 1636

## 2015-04-03 NOTE — Discharge Instructions (Signed)
Your labs are unremarkable. Use flexeril as needed for spasms. Stay well hydrated. Get shoe inserts that may help with muscle fatigue in your legs. Follow up with [facility] in 1-2 weeks for ongoing management and to establish care. Consider following up with neurology in 1-2 months if your spasms continue to occur, for ongoing testing of possible causes. Return to the ER for changes or worsening symptoms.  Back Pain: Your back pain should be treated with medicines such as ibuprofen or aleve and this back pain should get better over the next 2 weeks.  However if you develop severe or worsening pain, low back pain with fever, numbness, weakness or inability to walk or urinate, you should return to the ER immediately.  Please follow up with your doctor this week for a recheck if still having symptoms.  Avoid heavy lifting over 10 pounds over the next two weeks.  Low back pain is discomfort in the lower back that may be due to injuries to muscles and ligaments around the spine.  Occasionally, it may be caused by a a problem to a part of the spine called a disc.  The pain may last several days or a week;  However, most patients get completely well in 4 weeks.  Self - care:  The application of heat can help soothe the pain.  Maintaining your daily activities, including walking, is encourged, as it will help you get better faster than just staying in bed. Perform gentle stretching as discussed. Drink plenty of fluids.  Medications are also useful to help with pain control.  A commonly prescribed medication includes tylenol.  Non steroidal anti inflammatory medications including Ibuprofen and naproxen;  These medications help both pain and swelling and are very useful in treating back pain.  They should be taken with food, as they can cause stomach upset, and more seriously, stomach bleeding.    Muscle relaxants (flexeril):  These medications can help with muscle tightness that is a cause of  lower back pain.  Most of these medications can cause drowsiness, and it is not safe to drive or use dangerous machinery while taking them.  SEEK IMMEDIATE MEDICAL ATTENTION IF: New numbness, tingling, weakness, or problem with the use of your arms or legs.  Severe back pain not relieved with medications.  Difficulty with or loss of control of your bowel or bladder control.  Increasing pain in any areas of the body (such as chest or abdominal pain).  Shortness of breath, dizziness or fainting.  Nausea (feeling sick to your stomach), vomiting, fever, or sweats.  You will need to follow up with [facility] in 1-2 weeks for reassessment and to establish medical care.   Back Pain, Adult Back pain is very common in adults.The cause of back pain is rarely dangerous and the pain often gets better over time.The cause of your back pain may not be known. Some common causes of back pain include:  Strain of the muscles or ligaments supporting the spine.  Wear and tear (degeneration) of the spinal disks.  Arthritis.  Direct injury to the back. For many people, back pain may return. Since back pain is rarely dangerous, most people can learn to manage this condition on their own. HOME CARE INSTRUCTIONS Watch your back pain for any changes. The following actions may help to lessen any discomfort you are feeling:  Remain active. It is stressful on your back to sit or stand in one place for long periods  of time. Do not sit, drive, or stand in one place for more than 30 minutes at a time. Take short walks on even surfaces as soon as you are able.Try to increase the length of time you walk each day.  Exercise regularly as directed by your health care provider. Exercise helps your back heal faster. It also helps avoid future injury by keeping your muscles strong and flexible.  Do not stay in bed.Resting more than 1-2 days can delay your recovery.  Pay attention to your body when you bend  and lift. The most comfortable positions are those that put less stress on your recovering back. Always use proper lifting techniques, including:  Bending your knees.  Keeping the load close to your body.  Avoiding twisting.  Find a comfortable position to sleep. Use a firm mattress and lie on your side with your knees slightly bent. If you lie on your back, put a pillow under your knees.  Avoid feeling anxious or stressed.Stress increases muscle tension and can worsen back pain.It is important to recognize when you are anxious or stressed and learn ways to manage it, such as with exercise.  Take medicines only as directed by your health care provider. Over-the-counter medicines to reduce pain and inflammation are often the most helpful.Your health care provider may prescribe muscle relaxant drugs.These medicines help dull your pain so you can more quickly return to your normal activities and healthy exercise.  Apply ice to the injured area:  Put ice in a plastic bag.  Place a towel between your skin and the bag.  Leave the ice on for 20 minutes, 2-3 times a day for the first 2-3 days. After that, ice and heat may be alternated to reduce pain and spasms.  Maintain a healthy weight. Excess weight puts extra stress on your back and makes it difficult to maintain good posture. SEEK MEDICAL CARE IF:  You have pain that is not relieved with rest or medicine.  You have increasing pain going down into the legs or buttocks.  You have pain that does not improve in one week.  You have night pain.  You lose weight.  You have a fever or chills. SEEK IMMEDIATE MEDICAL CARE IF:   You develop new bowel or bladder control problems.  You have unusual weakness or numbness in your arms or legs.  You develop nausea or vomiting.  You develop abdominal pain.  You feel faint.   This information is not intended to replace advice given to you by your health care provider. Make sure you  discuss any questions you have with your health care provider.   Document Released: 05/07/2005 Document Revised: 05/28/2014 Document Reviewed: 09/08/2013 Elsevier Interactive Patient Education 2016 [place] Injury Prevention Back injuries can be very painful. They can also be difficult to heal. After having one back injury, you are more likely to injure your back again. It is important to learn how to avoid injuring or re-injuring your back. The following tips can help you to prevent a back injury. WHAT SHOULD I KNOW ABOUT PHYSICAL FITNESS?  Exercise for 30 minutes per day on most days of the week or as directed by your health care provider. Make sure to:  Do aerobic exercises, such as walking, jogging, biking, or swimming.  Do exercises that increase balance and strength, such as tai chi and yoga. These can decrease your risk of falling and injuring your back.  Do stretching exercises to help with flexibility.  Try to develop strong abdominal muscles. Your abdominal muscles provide a lot of the support that is needed by your back.  Maintain a healthy weight. This helps to decrease your risk of a back injury. WHAT SHOULD I KNOW ABOUT MY DIET?  Talk with your health care provider about your overall diet. Take supplements and vitamins only as directed by your health care provider.  Talk with your health care provider about how much calcium and vitamin D you need each day. These nutrients help to prevent weakening of the bones (osteoporosis). Osteoporosis can cause broken (fractured) bones, which lead to back pain.  Include good sources of calcium in your diet, such as dairy products, green leafy vegetables, and products that have had calcium added to them (fortified).  Include good sources of vitamin D in your diet, such as milk and foods that are fortified with vitamin D. WHAT SHOULD I KNOW ABOUT MY POSTURE?  Sit up straight and stand up straight. Avoid leaning forward when  you sit or hunching over when you stand.  Choose chairs that have good low-back (lumbar) support.  If you work at a desk, sit close to it so you do not need to lean over. Keep your chin tucked in. Keep your neck drawn back, and keep your elbows bent at a right angle. Your arms should look like the letter "L."  Sit high and close to the steering wheel when you drive. Add a lumbar support to your car seat, if needed.  Avoid sitting or standing in one position for very long. Take breaks to get up, stretch, and walk around at least one time every hour. Take breaks every hour if you are driving for long periods of time.  Sleep on your side with your knees slightly bent, or sleep on your back with a pillow under your knees. Do not lie on the front of your body to sleep. WHAT SHOULD I KNOW ABOUT LIFTING, TWISTING, AND REACHING? Lifting and Heavy Lifting  Avoid heavy lifting, especially repetitive heavy lifting. If you must do heavy lifting:  Stretch before lifting.  Work slowly.  Rest between lifts.  Use a tool such as a cart or a dolly to move objects if one is available.  Make several small trips instead of carrying one heavy load.  Ask for help when you need it, especially when moving big objects.  Follow these steps when lifting:  Stand with your feet shoulder-width apart.  Get as close to the object as you can. Do not try to pick up a heavy object that is far from your body.  Use handles or lifting straps if they are available.  Bend at your knees. Squat down, but keep your heels off the floor.  Keep your shoulders pulled back, your chin tucked in, and your back straight.  Lift the object slowly while you tighten the muscles in your legs, abdomen, and buttocks. Keep the object as close to the center of your body as possible.  Follow these steps when putting down a heavy load:  Stand with your feet shoulder-width apart.  Lower the object slowly while you tighten the muscles  in your legs, abdomen, and buttocks. Keep the object as close to the center of your body as possible.  Keep your shoulders pulled back, your chin tucked in, and your back straight.  Bend at your knees. Squat down, but keep your heels off the floor.  Use handles or lifting straps if they are available. Twisting and  Reaching 1. Avoid lifting heavy objects above your waist. 2. Do not twist at your waist while you are lifting or carrying a load. If you need to turn, move your feet. 3. Do not bend over without bending at your knees. 4. Avoid reaching over your head, across a table, or for an object on a high surface. WHAT ARE SOME OTHER TIPS? 1. Avoid wet floors and icy ground. Keep sidewalks clear of ice to prevent falls. 2. Do not sleep on a mattress that is too soft or too hard. 3. Keep items that are used frequently within easy reach. 4. Put heavier objects on shelves at waist level, and put lighter objects on lower or higher shelves. 5. Find ways to decrease your stress, such as exercise, massage, or relaxation techniques. Stress can build up in your muscles. Tense muscles are more vulnerable to injury. 6. Talk with your health care provider if you feel anxious or depressed. These conditions can make back pain worse. 7. Wear flat heel shoes with cushioned soles. 8. Avoid sudden movements. 9. Use both shoulder straps when carrying a backpack. 10. Do not use any tobacco products, including cigarettes, chewing tobacco, or electronic cigarettes. If you need help quitting, ask your health care provider.   This information is not intended to replace advice given to you by your health care provider. Make sure you discuss any questions you have with your health care provider.   Document Released: 06/14/2004 Document Revised: 09/21/2014 Document Reviewed: 05/11/2014 Elsevier Interactive Patient Education 2016 [employer]  Back Exercises The following exercises strengthen the muscles that help  to support the back. They also help to keep the lower back flexible. Doing these exercises can help to prevent back pain or lessen existing pain. If you have back pain or discomfort, try doing these exercises 2-3 times each day or as told by your health care provider. When the pain goes away, do them once each day, but increase the number of times that you repeat the steps for each exercise (do more repetitions). If you do not have back pain or discomfort, do these exercises once each day or as told by your health care provider. EXERCISES Single Knee to Chest Repeat these steps 3-5 times for each leg:  Lie on your back on a firm bed or the floor with your legs extended.  Bring one knee to your chest. Your other leg should stay extended and in contact with the floor.  Hold your knee in place by grabbing your knee or thigh.  Pull on your knee until you feel a gentle stretch in your lower back.  Hold the stretch for 10-30 seconds.  Slowly release and straighten your leg. Pelvic Tilt Repeat these steps 5-10 times:  Lie on your back on a firm bed or the floor with your legs extended.  Bend your knees so they are pointing toward the ceiling and your feet are flat on the floor.  Tighten your lower abdominal muscles to press your lower back against the floor. This motion will tilt your pelvis so your tailbone points up toward the ceiling instead of pointing to your feet or the floor.  With gentle tension and even breathing, hold this position for 5-10 seconds. Cat-Cow Repeat these steps until your lower back becomes more flexible:  Get into a hands-and-knees position on a firm surface. Keep your hands under your shoulders, and keep your knees under your hips. You may place padding under your knees for comfort.  Let  your head hang down, and point your tailbone toward the floor so your lower back becomes rounded like the back of a cat.  Hold this position for 5 seconds.  Slowly lift your  head and point your tailbone up toward the ceiling so your back forms a sagging arch like the back of a cow.  Hold this position for 5 seconds. Press-Ups Repeat these steps 5-10 times:  Lie on your abdomen (face-down) on the floor.  Place your palms near your head, about shoulder-width apart.  While you keep your back as relaxed as possible and keep your hips on the floor, slowly straighten your arms to raise the top half of your body and lift your shoulders. Do not use your back muscles to raise your upper torso. You may adjust the placement of your hands to make yourself more comfortable.  Hold this position for 5 seconds while you keep your back relaxed.  Slowly return to lying flat on the floor. Bridges Repeat these steps 10 times: 5. Lie on your back on a firm surface. 6. Bend your knees so they are pointing toward the ceiling and your feet are flat on the floor. 7. Tighten your buttocks muscles and lift your buttocks off of the floor until your waist is at almost the same height as your knees. You should feel the muscles working in your buttocks and the back of your thighs. If you do not feel these muscles, slide your feet 1-2 inches farther away from your buttocks. 8. Hold this position for 3-5 seconds. 9. Slowly lower your hips to the starting position, and allow your buttocks muscles to relax completely. If this exercise is too easy, try doing it with your arms crossed over your chest. Abdominal Crunches Repeat these steps 5-10 times: 11. Lie on your back on a firm bed or the floor with your legs extended. 12. Bend your knees so they are pointing toward the ceiling and your feet are flat on the floor. 21. Cross your arms over your chest. 14. Tip your chin slightly toward your chest without bending your neck. 33. Tighten your abdominal muscles and slowly raise your trunk (torso) high enough to lift your shoulder blades a tiny bit off of the floor. Avoid raising your torso higher  than that, because it can put too much stress on your low back and it does not help to strengthen your abdominal muscles. 16. Slowly return to your starting position. Back Lifts Repeat these steps 5-10 times: 1. Lie on your abdomen (face-down) with your arms at your sides, and rest your forehead on the floor. 2. Tighten the muscles in your legs and your buttocks. 3. Slowly lift your chest off of the floor while you keep your hips pressed to the floor. Keep the back of your head in line with the curve in your back. Your eyes should be looking at the floor. 4. Hold this position for 3-5 seconds. 5. Slowly return to your starting position. SEEK MEDICAL CARE IF:  Your back pain or discomfort gets much worse when you do an exercise.  Your back pain or discomfort does not lessen within 2 hours after you exercise. If you have any of these problems, stop doing these exercises right away. Do not do them again unless your health care provider says that you can. SEEK IMMEDIATE MEDICAL CARE IF:  You develop sudden, severe back pain. If this happens, stop doing the exercises right away. Do not do them again unless your health  care provider says that you can.   This information is not intended to replace advice given to you by your health care provider. Make sure you discuss any questions you have with your health care provider.   Document Released: 06/14/2004 Document Revised: 01/26/2015 Document Reviewed: 07/01/2014 Elsevier Interactive Patient Education 2016 [place] therapy can help ease sore, stiff, injured, and tight muscles and joints. Heat relaxes your muscles, which may help ease your pain. Heat therapy should only be used on old, pre-existing, or long-lasting (chronic) injuries. Do not use heat therapy unless told by your doctor. HOW TO USE HEAT THERAPY There are several different kinds of heat therapy, including:  Moist heat pack.  Warm water bath.  Hot water  bottle.  Electric heating pad.  Heated gel pack.  Heated wrap.  Electric heating pad. GENERAL HEAT THERAPY RECOMMENDATIONS   Do not sleep while using heat therapy. Only use heat therapy while you are awake.  Your skin may turn pink while using heat therapy. Do not use heat therapy if your skin turns red.  Do not use heat therapy if you have new pain.  High heat or long exposure to heat can cause burns. Be careful when using heat therapy to avoid burning your skin.  Do not use heat therapy on areas of your skin that are already irritated, such as with a rash or sunburn. GET HELP IF:   You have blisters, redness, swelling (puffiness), or numbness.  You have new pain.  Your pain is worse. MAKE SURE YOU:  Understand these instructions.  Will watch your condition.  Will get help right away if you are not doing well or get worse.   This information is not intended to replace advice given to you by your health care provider. Make sure you discuss any questions you have with your health care provider.   Document Released: 07/30/2011 Document Revised: 05/28/2014 Document Reviewed: 06/30/2013 Elsevier Interactive Patient Education 2016 [employer] Inc[employer]  Muscle Cramps and Spasms Muscle cramps and spasms are when muscles tighten by themselves. They usually get better within minutes. Muscle cramps are painful. They are usually stronger and last longer than muscle spasms. Muscle spasms may or may not be painful. They can last a few seconds or much longer. HOME CARE  Drink enough fluid to keep your pee (urine) clear or pale yellow.  Massage, stretch, and relax the muscle.  Use a warm towel, heating pad, or warm shower water on tight muscles.  Place ice on the muscle if it is tender or in pain.  Put ice in a plastic bag.  Place a towel between your skin and the bag.  Leave the ice on for 15-20 minutes, 03-04 times a day.  Only take medicine as told by your doctor. GET HELP  RIGHT AWAY IF:  Your cramps or spasms get worse, happen more often, or do not get better with time. MAKE SURE YOU:  Understand these instructions.  Will watch your condition.  Will get help right away if you are not doing well or get worse.   This information is not intended to replace advice given to you by your health care provider. Make sure you discuss any questions you have with your health care provider.   Document Released: 04/19/2008 Document Revised: 09/01/2012 Document Reviewed: 04/23/2012 Elsevier Interactive Patient Education 2016 [employer]  Leg Cramps Leg cramps occur when a muscle or muscles tighten and you have no control over this tightening (involuntary  muscle contraction). Muscle cramps can develop in any muscle, but the most common place is in the calf muscles of the leg. Those cramps can occur during exercise or when you are at rest. Leg cramps are painful, and they may last for a few seconds to a few minutes. Cramps may return several times before they finally stop. Usually, leg cramps are not caused by a serious medical problem. In many cases, the cause is not known. Some common causes include:  Overexertion.  Overuse from repetitive motions, or doing the same thing over and over.  Remaining in a certain position for a long period of time.  Improper preparation, form, or technique while performing a sport or an activity.  Dehydration.  Injury.  Side effects of some medicines.  Abnormally low levels of the salts and ions in your blood (electrolytes), especially potassium and calcium. These levels could be low if you are taking water pills (diuretics) or if you are pregnant. HOME CARE INSTRUCTIONS Watch your condition for any changes. Taking the following actions may help to lessen any discomfort that you are feeling:  Stay well-hydrated. Drink enough fluid to keep your urine clear or pale yellow.  Try massaging, stretching, and relaxing the affected  muscle. Do this for several minutes at a time.  For tight or tense muscles, use a warm towel, heating pad, or hot shower water directed to the affected area.  If you are sore or have pain after a cramp, applying ice to the affected area may relieve discomfort.  Put ice in a plastic bag.  Place a towel between your skin and the bag.  Leave the ice on for 20 minutes, 2-3 times per day.  Avoid strenuous exercise for several days if you have been having frequent leg cramps.  Make sure that your diet includes the essential minerals for your muscles to work normally.  Take medicines only as directed by your health care provider. SEEK MEDICAL CARE IF:  Your leg cramps get more severe or more frequent, or they do not improve over time.  Your foot becomes cold, numb, or blue.   This information is not intended to replace advice given to you by your health care provider. Make sure you discuss any questions you have with your health care provider.   Document Released: 06/14/2004 Document Revised: 09/21/2014 Document Reviewed: 04/14/2014 Elsevier Interactive Patient Education 2016 [employer] Inc[employer]  Spasticity Spasticity is a condition in which certain muscles contract continuously. This causes stiffness or tightness of the muscles. It may interfere with movement, speech, and manner of walking. CAUSES  This condition is usually caused by damage to the portion of the brain or spinal cord that controls voluntary movement. It may occur in association with:  Spinal cord injury.  Multiple sclerosis.  Cerebral palsy.  Brain damage due to lack of oxygen.  Brain trauma.  Severe head injury.  Metabolic diseases such as:  Adrenoleukodystrophy.  ALS [place] Cerise Gehrig's disease).  Phenylketonuria. SYMPTOMS   Increased muscle tone (hypertonicity).  A series of rapid muscle contractions (clonus).  Exaggerated deep tendon reflexes.  Muscle spasms.  Involuntary crossing of the legs  (scissoring).  Fixed joints. The degree of spasticity varies. It ranges from mild muscle stiffness to severe, painful, and uncontrollable muscle spasms. It can interfere with rehabilitation in patients with certain disorders. It often interferes with daily activities. TREATMENT  Treatment may include:  Medications.  Physical therapy regimens. They may include muscle stretching and range of motion exercises. These help prevent  shrinkage or shortening of muscles. They also help reduce the severity of symptoms.  Surgery. This may be recommended for tendon release or to sever the nerve-muscle pathway. PROGNOSIS  The outcome for those with spasticity depends on:  Severity of the spasticity.  Associated disorder(s).   This information is not intended to replace advice given to you by your health care provider. Make sure you discuss any questions you have with your health care provider.   Document Released: 04/27/2002 Document Revised: 07/30/2011 Document Reviewed: 07/21/2013 Elsevier Interactive Patient Education [employer].

## 2015-04-03 NOTE — ED Notes (Signed)
Pt stable, ambulatory, states understanding of discharge instructions 

## 2015-04-03 NOTE — ED Notes (Signed)
Pt c/o low back pain and leg pain x 2 weeks. Pt reports same happened in past and was given muscle relaxers.

## 2015-04-14 ENCOUNTER — Encounter (HOSPITAL_COMMUNITY): Payer: Self-pay | Admitting: *Deleted

## 2015-04-14 ENCOUNTER — Emergency Department (HOSPITAL_COMMUNITY)
Admission: EM | Admit: 2015-04-14 | Discharge: 2015-04-14 | Disposition: A | Discharge status: Home / Self Care | Payer: Self-pay | Attending: Emergency Medicine | Admitting: Emergency Medicine

## 2015-04-14 DIAGNOSIS — Z79899 Other long term (current) drug therapy: Secondary | ICD-10-CM | POA: Insufficient documentation

## 2015-04-14 DIAGNOSIS — Z8719 Personal history of other diseases of the digestive system: Secondary | ICD-10-CM | POA: Insufficient documentation

## 2015-04-14 DIAGNOSIS — F17200 Nicotine dependence, unspecified, uncomplicated: Secondary | ICD-10-CM | POA: Insufficient documentation

## 2015-04-14 DIAGNOSIS — R252 Cramp and spasm: Secondary | ICD-10-CM | POA: Insufficient documentation

## 2015-04-14 LAB — TSH: TSH: 1.331 u[IU]/mL (ref 0.350–4.500)

## 2015-04-14 LAB — MAGNESIUM: Magnesium: 2.1 mg/dL (ref 1.7–2.4)

## 2015-04-14 LAB — BASIC METABOLIC PANEL
Anion gap: 7 (ref 5–15)
BUN: 17 mg/dL (ref 6–20)
CO2: 26 mmol/L (ref 22–32)
Calcium: 9.6 mg/dL (ref 8.9–10.3)
Chloride: 106 mmol/L (ref 101–111)
Creatinine, Ser: 1.02 mg/dL (ref 0.61–1.24)
Glucose, Bld: 99 mg/dL (ref 65–99)
Potassium: 4.3 mmol/L (ref 3.5–5.1)
SODIUM: 139 mmol/L (ref 135–145)

## 2015-04-14 NOTE — ED Notes (Signed)
Pt presents via POV c/o genrealized muscle spasms and cramps x 3 weeks, mainly in feet and hands.  Reports being seen x 1 week ago for simlilar symptoms and was prescribed naproxen and muscle relaxer but they made him feel "crazy".  Pt a x 4, NAD.  Ambulatory.

## 2015-04-14 NOTE — Discharge Instructions (Signed)
Read the information below.  Use the prescribed medication as directed.  Please discuss all new medications with your pharmacist.  You may return to the Emergency Department at any time for worsening condition or any new symptoms that concern you.     Muscle Cramps and Spasms Muscle cramps and spasms occur when a muscle or muscles tighten and you have no control over this tightening (involuntary muscle contraction). They are a common problem and can develop in any muscle. The most common place is in the calf muscles of the leg. Both muscle cramps and muscle spasms are involuntary muscle contractions, but they also have differences:   Muscle cramps are sporadic and painful. They may last a few seconds to a quarter of an hour. Muscle cramps are often more forceful and last longer than muscle spasms.  Muscle spasms may or may not be painful. They may also last just a few seconds or much longer. CAUSES  It is uncommon for cramps or spasms to be due to a serious underlying problem. In many cases, the cause of cramps or spasms is unknown. Some common causes are:   Overexertion.   Overuse from repetitive motions (doing the same thing over and over).   Remaining in a certain position for a long period of time.   Improper preparation, form, or technique while performing a sport or activity.   Dehydration.   Injury.   Side effects of some medicines.   Abnormally low levels of the salts and ions in your blood (electrolytes), especially potassium and calcium. This could happen if you are taking water pills (diuretics) or you are pregnant.  Some underlying medical problems can make it more likely to develop cramps or spasms. These include, but are not limited to:   Diabetes.   Parkinson disease.   Hormone disorders, such as thyroid problems.   Alcohol abuse.   Diseases specific to muscles, joints, and bones.   Blood vessel disease where not enough blood is getting to the  muscles.  HOME CARE INSTRUCTIONS   Stay well hydrated. Drink enough water and fluids to keep your urine clear or pale yellow.  It may be helpful to massage, stretch, and relax the affected muscle.  For tight or tense muscles, use a warm towel, heating pad, or hot shower water directed to the affected area.  If you are sore or have pain after a cramp or spasm, applying ice to the affected area may relieve discomfort.  Put ice in a plastic bag.  Place a towel between your skin and the bag.  Leave the ice on for 15-20 minutes, 03-04 times a day.  Medicines used to treat a known cause of cramps or spasms may help reduce their frequency or severity. Only take over-the-counter or prescription medicines as directed by your caregiver. SEEK MEDICAL CARE IF:  Your cramps or spasms get more severe, more frequent, or do not improve over time.  MAKE SURE YOU:   Understand these instructions.  Will watch your condition.  Will get help right away if you are not doing well or get worse.   This information is not intended to replace advice given to you by your health care provider. Make sure you discuss any questions you have with your health care provider.   Document Released: 10/27/2001 Document Revised: 09/01/2012 Document Reviewed: 04/23/2012 Elsevier Interactive Patient Education 2016 [employer].    Emergency Department Resource Guide 1) Find a Doctor and Pay Out of Pocket Although you won't have  to find out who is covered by your insurance plan, it is a good idea to ask around and get recommendations. You will then need to call the office and see if the doctor you have chosen will accept you as a new patient and what types of options they offer for patients who are self-pay. Some doctors offer discounts or will set up payment plans for their patients who do not have insurance, but you will need to ask so you aren't surprised when you get to your appointment.  2) Contact Your Local  Health Department Not all health departments have doctors that can see patients for sick visits, but many do, so it is worth a call to see if yours does. If you don't know where your local health department is, you can check in your phone book. The CDC also has a tool to help you locate your state's health department, and many state websites also have listings of all of their local health departments.  3) Find a Walk-in Clinic If your illness is not likely to be very severe or complicated, you may want to try a walk in clinic. These are popping up all over the country in pharmacies, drugstores, and shopping centers. They're usually staffed by nurse practitioners or physician assistants that have been trained to treat common illnesses and complaints. They're usually fairly quick and inexpensive. However, if you have serious medical issues or chronic medical problems, these are probably not your best option.  No Primary Care Doctor: - Call Health Connect at  [phone] - they can help you locate a primary care doctor that  accepts your insurance, provides certain services, etc. - Physician Referral Service- [phone]  Chronic Pain Problems: Organization         Address  Phone   Notes  [facility]  [phone] Patients need to be referred by their primary care doctor.   Medication Assistance: Organization         Address  Phone   Notes  [facility] Medication [facility] [address] [place] [place], [place] [address] [phone] --Must be a resident of [facility] -- Must have NO insurance coverage whatsoever (no Medicaid/ Medicare, etc.) -- The pt. MUST have a primary care doctor that directs their care regularly and follows them in the community   [facility]  [phone]   [employer]  [phone]    Agencies that provide inexpensive medical care: Organization         Address  Phone   Notes  [facility]  [phone]    [facility]    [phone]   [facility] [address] [place], [place] [address] [phone]   [facility] [address] [place] [place] [phone]   Planned Parenthood    [phone]   [facility]    [phone]   [facility]  [address], [place] Phone:  [phone], Fax:  [phone] Hours of Operation:  9 am - 6 pm, M-F.  Also accepts Medicaid/Medicare and self-pay.  [facility]  [address], [place] Phone: [phone], Fax: [phone]. Hours of Operation:  8:30 am - 5:30 pm, M-F.  Also accepts Medicaid and self-pay.  [facility] [place] [address] [employer] Phone: [phone]   [facility] [address] [place],  [place], [place] [phone], Ext. [phone] Mondays & Thursdays: 7-9 AM.  First 15 patients are seen on a first come, first serve basis.    Medicaid-accepting [facility] Providers:  Organization         Address  Phone   Notes  [facility] [address], [place] [phone] Also accepts self-pay patients.  [facility] [address] Laurell Josephs [place], [place]  [phone]   [facility] [address], [place] [phone]   [facility] [address], [place] [phone]   Renaye Rakers [address], [place]   [phone] Only accepts [place] Access [place] patients after they have their name applied to their card.   Self-Pay (no insurance) in [facility]:  Organization         Address  Phone   Notes  Sickle Cell Patients, [facility] [address] [place], [place] [phone]   [facility] [address] [place], [place] [phone]   [facility] [place]  [address],  [place] [phone]   [facility]/Dr. Osei-Bonsu  [address] [place] or [address], [place] [phone] Phone number for both [place] and [place] locations is the same.  [facility] [address] Neesa Knapik [place] [phone]   [facility] [address], [place] or [address] [phone]   [facility] [address] [place] [phone], phone; [phone], fax Sees patients 1st and 3rd Saturday of every month.  Must not qualify for public or private insurance (i.e. Medicaid, Medicare, [place] Health Choice, Veterans' Benefits)  Household income should be no more than 200% of the poverty level The clinic cannot treat you if you are pregnant or think you are pregnant  Sexually transmitted diseases are not treated at the clinic.    Dental Care: Organization         Address  Phone  Notes  [facility] Department of [facility] [address] [place], [place] [phone] Accepts children up to age 11 who are enrolled in [place] or [place] Health Choice; pregnant women with a Medicaid card; and children who have applied for Medicaid or [place] Health Choice, but were declined, whose parents can pay a reduced fee at time of service.  [facility] Department of [facility]  [address], [place] [phone] Accepts children up to age 74 who are enrolled in [place] or [place] Health Choice; pregnant women with a Medicaid card; and children who have applied for Medicaid or [place] Health Choice, but were declined, whose parents can pay a reduced fee at time of service.  [facility]  [address] [place], [place] [phone] Patients are seen by appointment only. Walk-ins are not accepted. [facility] will see patients 75 years of age and older. Monday - Tuesday (8am-5pm) Most Wednesdays  (8:30-5pm) $30 per visit, cash only  [facility] Access [facility]  [address], [facility] [phone] Patients are seen by appointment only. Walk-ins are not accepted. [facility] will see patients 20 years of age and older. One Wednesday Evening (Monthly: Volunteer Based).  $30 per visit, cash only  [employer] of Dentistry  Clinics  [phone] for adults; Children under age 434, call Graduate Pediatric Dentistry at [phone]. Children aged 654-14, please call [phone] to request a pediatric application.  Dental services are provided in all areas of dental care including fillings, crowns and bridges, complete and partial dentures, implants, gum treatment, root canals, and extractions. Preventive care is also provided. Treatment is provided to both adults and children. Patients are selected via a lottery and there is often a waiting list.   [facility] [address], [place]  [phone] [email or web address]   [facility] [address], [place], [place] [phone], Ext. [phone] Second and Fourth Thursday of each month, opens at 6:30 AM; Clinic ends at 9 AM.  Patients are seen on a first-come first-served basis, and a limited number are seen during each clinic.   [facility]  [address], [place], [place] [phone]) [phone]   Eligibility Requirements You must have lived in [place], [place], or [place] counties for at least the last three months.   You cannot be eligible for state or federal sponsored [place] Cityhealthcare insurance, including CIGNAVeterans Administration, [place]icaid, or [employer]icare.   You generally cannot be eligible for healthcare insurance through your employer.    How to apply: Eligibility screenings are held every Tuesday and Wednesday afternoon from 1:00 pm until 4:00 pm. You do not need an appointment for the interview!  [facility] [address], [place], [place] [phone]   [facility]  Health Department  [phone]   [facility]  [phone]   [facility]  [phone]    Behavioral Health Resources in the Community: Intensive Outpatient Programs Organization         Address  Phone  Notes  [facility] [address], [place], [place] [phone]   [facility] [address], [place], [place] [phone]   [facility] [address], [place], [place]  [phone]   [facility] [address],  [place], [place] [phone] or [phone]   Substance Abuse Resources Organization         Address  Phone  Notes  [facility]  [phone]   [facility]  [phone]   [facility]  [phone]   [facility]  [phone]   Residential & Outpatient Substance Abuse Program  [phone]   Psychological Services Organization         Address  Phone  Notes  [facility]  [phone]- [phone]   [facility]  [phone]   [facility] [address], [place] [phone] or [phone]    Mobile Crisis Teams Organization         Address  Phone  Notes  [facility], Mobile Crisis Care Unit  [phone]   [facility]  [address] [place], [place] [phone]   Doristine LocksSharon DeEsch [address] [place] [phone]    Self-Help/Support Groups Organization         Address  Phone             Notes  [facility] - variety of support groups  336- [phone] Call for more information  Narcotics Anonymous (NA), [facility] [address], [place]  2 meetings at this location   Chief Executive Officeresidential Treatment Programs Organization         Address  Phone  Notes  [facility] [address]    [place]  [phone]   [facility]  [address], [place] [address], [place], [place]  [phone]   [facility] Treatment Facility [address] [place], [place] [phone] Admissions: 8am-3pm M-F  [facility] [address],    [place], [place] [phone]   [facility] [address] [place], [place], [place] [phone]   [facility] [address],  [place], [place] [phone]   [facility] - Intensive Outpatient [address] Laurell Joseph[address], [place], [place] [phone]   [facility] (Addiction Recovery [facility] [address]  [place], [place] [phone] or [phone]   [facility] ([facility]) [address], [place], [place] [phone] Accepts Medicaid  [facility] [address]  [place] [phone] Substance Abuse/Addiction Treatment   [facility] Organization         Address  Phone  Notes  [facility]  [phone]   Angie Fava, PhD [address] Ervin Knack [place], [place]   [phone] or [phone]   [facility]   [address] [place], [place] [phone]   [facility] [address] [place], [place] [phone] Insurance/Medicaid/sponsorship through [facility] [address]                                    [place], [place] [phone] Therapy/tele-psych/case  [facility] [address], [place] [phone]    Dr. Lolly Mustache  [phone]   [facility]  [employer] [facility] Dept. 1) [address] [place] 2) [address], [place] 3)  [address], [place] [phone]  [phone]   [facility] Child Abuse Hotline 859-789-3123 or [phone] (After Hours)

## 2015-04-14 NOTE — ED Provider Notes (Signed)
CSN: [id number]     Arrival date & time 04/14/15  0827 History   First MD Initiated Contact with Patient 04/14/15 [phone]     Chief Complaint  Patient presents with  . Spasms     (Consider location/radiation/quality/duration/timing/severity/associated sxs/prior Treatment) The history is provided by the patient.     Patient presents with cramping and spasms all over his body, worse in his feet and legs.  States he has had the intermittently for months and does not know why.  He came in today because he felt cramps coming on his feet.  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  He also went to a shoe store recently and found out that he has flat feet and is wear a shoe that is too small - told he should be in a 12, wears a 10 1/2.  Denies fevers, CP, SOB, cough, leg swelling, any injury.  Has not yet gotten new shoes.  Has been to ED several times for same with negative workup, does not have PCP.    Past Medical History  Diagnosis Date  . Hemorrhoids    History reviewed. No pertinent past surgical history. No family history on file. Social History  Substance Use Topics  . Smoking status: Current Every Day Smoker -- 0.50 packs/day  . Smokeless tobacco: None  . Alcohol Use: No    Review of Systems  All other systems reviewed and are negative.     Allergies  Review of patient's allergies indicates no known allergies.  Home Medications   Prior to Admission medications   Medication Sig Start Date End Date Taking? Authorizing Provider  ibuprofen (ADVIL,MOTRIN) 200 MG tablet Take 200-800 mg by mouth every 6 (six) hours as needed. pain    Yes Historical Provider, MD  naproxen (NAPROSYN) 500 MG tablet Take 1 tablet (500 mg total) by mouth 2 (two) times daily as needed for mild pain or moderate pain (TAKE WITH MEALS.). 04/03/15  Yes Mercedes Camprubi-Soms, PA-C  terbinafine  (LAMISIL) 1 % cream Apply 1 application topically daily.   Yes Historical Provider, MD   BP 136/88 mmHg  Pulse 82  Temp(Src) 97.7 F (36.5 C) (Oral)  Resp 16  Ht 5\' 6"  (1.676 m)  Wt 104.327 kg  BMI 37.14 kg/m2  SpO2 100% Physical Exam  Constitutional: He appears well-developed and well-nourished. No distress.  HENT:  Head: Normocephalic and atraumatic.  Neck: Neck supple.  Cardiovascular: Normal rate and regular rhythm.   Pulmonary/Chest: Effort normal and breath sounds normal. No respiratory distress. He has no wheezes. He has no rales.  Abdominal: Soft. He exhibits no distension and no mass. There is no tenderness. There is no rebound and no guarding.  Musculoskeletal: He exhibits no edema or tenderness.  Neurological: He is alert. He exhibits normal muscle tone.  Skin: He is not diaphoretic.  White macerated skin between all toes.  No erythema, edema, warmth, or tenderness   Nursing note and vitals reviewed.   ED Course  Procedures (including critical care time) Labs Review Labs Reviewed  BASIC METABOLIC PANEL  MAGNESIUM  TSH    Imaging Review No results found. I have personally reviewed and evaluated these images and lab results as part of my medical decision-making.   EKG Interpretation None      MDM   Final diagnoses:  Muscle cramps    Afebrile, nontoxic  patient with  Recurrent spasms and cramps for unknown reason.  Possibly from working in [employer] on concrete floors in shoes that are too small and flat feet.  Workup unremarkable. D/C home with PCP follow up. Pt is treating his own athlete's foot with OTC medications.  Discussed result, findings, treatment, and follow up  with patient.  Pt given return precautions.  Pt verbalizes understanding and agrees with plan.         Trixie Dredge, PA-C 04/14/15 1507  Margarita Grizzle, MD 04/23/15 [phone]

## 2015-04-19 ENCOUNTER — Ambulatory Visit: Payer: Self-pay | Attending: Family Medicine | Admitting: Physician Assistant

## 2015-04-19 ENCOUNTER — Encounter: Payer: Self-pay | Admitting: Physician Assistant

## 2015-04-19 VITALS — BP 129/76 | HR 85 | Temp 98.6°F | Resp 16 | Ht 66.0 in | Wt 218.0 lb

## 2015-04-19 DIAGNOSIS — M79671 Pain in right foot: Secondary | ICD-10-CM | POA: Insufficient documentation

## 2015-04-19 DIAGNOSIS — B353 Tinea pedis: Secondary | ICD-10-CM | POA: Insufficient documentation

## 2015-04-19 DIAGNOSIS — Z79899 Other long term (current) drug therapy: Secondary | ICD-10-CM | POA: Insufficient documentation

## 2015-04-19 DIAGNOSIS — M79672 Pain in left foot: Secondary | ICD-10-CM | POA: Insufficient documentation

## 2015-04-19 MED ORDER — NAPROXEN 500 MG PO TABS: 500.0000 mg | ORAL_TABLET | Freq: Two times a day (BID) | ORAL | Status: DC | PRN

## 2015-04-19 MED ORDER — GABAPENTIN 100 MG PO CAPS: 100.0000 mg | ORAL_CAPSULE | Freq: Three times a day (TID) | ORAL | Status: DC

## 2015-04-19 MED ORDER — TERBINAFINE HCL 1 % EX CREA: 1.0000 "application " | TOPICAL_CREAM | Freq: Every day | CUTANEOUS | Status: DC

## 2015-04-19 NOTE — Progress Notes (Signed)
Clayton Rodgers  UJW:[id number]  FAO:[id number]  DOB - Oct 24, 1985  Chief Complaint  Patient presents with  . Hospitalization Follow-up       Subjective:   Clayton Rodgers is a 29 y.o. male here today for  Establishment of care. This is the second visit in a couple weeks emergency department with complaints of back pain and pain in bilateral feet and legs. It's been going on for months. He gets a crampy sensation in his feet and also a numbness and tingling that sometimes travels up to his calf. He has to wear steel toed boots at work. He works for [employer] first shift. He has gotten new shoes without response in his symptoms. He's tried naproxen as well as muscle relaxers with temporary relief.  He also complains of athlete's foot. He has been trying some over-the-counter preparations with intermittent relief.  ROS: GEN: denies fever or chills, denies change in weight Skin: denies lesions or rashesV EXT: denies muscle spasms or swelling; = pain in lower ext, no weakness NEURO: + numbness or tingling, denies sz, stroke or TIA  ALLERGIES: No Known Allergies  PAST MEDICAL HISTORY: Past Medical History  Diagnosis Date  . Hemorrhoids     PAST SURGICAL HISTORY: No past surgical history on file.  MEDICATIONS AT HOME: Prior to Admission medications   Medication Sig Start Date End Date Taking? Authorizing Provider  cyclobenzaprine (FLEXERIL) 10 MG tablet Take 10 mg by mouth 3 (three) times daily as needed for muscle spasms.   Yes Historical Provider, MD  ibuprofen (ADVIL,MOTRIN) 200 MG tablet Take 200-800 mg by mouth every 6 (six) hours as needed. pain    Yes Historical Provider, MD  naproxen (NAPROSYN) 500 MG tablet Take 1 tablet (500 mg total) by mouth 2 (two) times daily as needed for mild pain or moderate pain (TAKE WITH MEALS.). 04/19/15  Yes Jeniel Slauson Netta Cedars, PA-C  gabapentin (NEURONTIN) 100 MG capsule Take 1 capsule (100 mg total) by mouth 3 (three) times daily.  04/19/15   Amali Uhls Netta Cedars, PA-C  terbinafine (LAMISIL) 1 % cream Apply 1 application topically daily.    Historical Provider, MD     Objective:   Filed Vitals:   04/19/15 1136  BP: 129/76  Pulse: 85  Temp: 98.6 F (37 C)  TempSrc: Other (Comment)  Resp: 16  Height:  (1.676 m)  Weight: 218 lb (98.884 kg)  SpO2: 96%    Exam General appearance : Awake, alert, not in any distress. Speech Clear. Not toxic looking HEENT: Atraumatic and Normocephalic, pupils equally reactive to light and accomodation Neck: supple, no JVD. No cervical lymphadenopathy.  Extremities: B/L Lower Ext shows no edema, both legs are warm to touch Neurology: Awake alert, and oriented X 3, CN II-XII intact, Non focal   Assessment & Plan  1. Bilateral foot pain-sounds Neuropathic  -Add neurontin  -Cont Naproxen as needed  -Lifestyle modification as much as possible (boots, work etc) 2. Athlete's foot/tinea pedis  -Lamisil  -encouraged to keep feet clean and dry; change socks mid shift   Return in about 4 weeks (around 05/17/2015).  The patient was given clear instructions to go to ER or return to medical center if symptoms don't improve, worsen or new problems develop. The patient verbalized understanding. The patient was told to call to get lab results if they haven't heard anything in the next week.   This note has been created with Education officer, environmental. Any transcriptional  errors are unintentional.    Scot Juniffany Aarna Mihalko, PA-C [facility] [place], [place] [phone]   04/19/2015, 11:59 AM

## 2015-04-19 NOTE — Progress Notes (Signed)
Pt's here for hospital f/up for muscle spasms. Pt states that pain in feet and legs. Rates pain 7/10 describes as a aching, throbbing, spasms.  Pt here for est. care PCP.

## 2016-05-10 ENCOUNTER — Encounter (HOSPITAL_COMMUNITY): Payer: Self-pay

## 2016-05-10 ENCOUNTER — Emergency Department (HOSPITAL_COMMUNITY)
Admission: EM | Admit: 2016-05-10 | Discharge: 2016-05-10 | Disposition: A | Discharge status: Home / Self Care | Payer: BLUE CROSS/BLUE SHIELD | Attending: Emergency Medicine | Admitting: Emergency Medicine

## 2016-05-10 DIAGNOSIS — R5383 Other fatigue: Secondary | ICD-10-CM | POA: Insufficient documentation

## 2016-05-10 DIAGNOSIS — F17200 Nicotine dependence, unspecified, uncomplicated: Secondary | ICD-10-CM | POA: Diagnosis not present

## 2016-05-10 LAB — CBC
HCT: 48.5 % (ref 39.0–52.0)
HEMOGLOBIN: 17.2 g/dL — AB (ref 13.0–17.0)
MCH: 31.8 pg (ref 26.0–34.0)
MCHC: 35.5 g/dL (ref 30.0–36.0)
MCV: 89.6 fL (ref 78.0–100.0)
PLATELETS: 257 10*3/uL (ref 150–400)
RBC: 5.41 MIL/uL (ref 4.22–5.81)
RDW: 12.4 % (ref 11.5–15.5)
WBC: 8.4 10*3/uL (ref 4.0–10.5)

## 2016-05-10 LAB — BASIC METABOLIC PANEL
ANION GAP: 10 (ref 5–15)
BUN: 13 mg/dL (ref 6–20)
CALCIUM: 9.5 mg/dL (ref 8.9–10.3)
CO2: 21 mmol/L — AB (ref 22–32)
Chloride: 108 mmol/L (ref 101–111)
Creatinine, Ser: 0.98 mg/dL (ref 0.61–1.24)
GFR calc non Af Amer: >60 mL/min (ref >60)
Glucose, Bld: 113 mg/dL — ABNORMAL HIGH (ref 65–99)
Potassium: 3.8 mmol/L (ref 3.5–5.1)
SODIUM: 139 mmol/L (ref 135–145)

## 2016-05-10 NOTE — ED Triage Notes (Signed)
Per Pt, Pt is coming from home with complaints of fatigue and weakness along with some nausea for two days. Denies chest pain or N/V. Pt is alert and orientedx4 upon arrival.

## 2016-05-10 NOTE — Discharge Instructions (Signed)
No specific instructions. All up with your physician if symptoms persist or worsen. Also recheck if you develop any change in your symptoms. Rest, stay hydrated, eat and sleep regularly.

## 2016-05-10 NOTE — ED Provider Notes (Signed)
[facility]EMER[facility]ENCY DEPT Provider Note   CSN: [id number] Arrival date & time: 05/10/16  1419  By signing my name below, I, Teofilo PodMatthew P. Jamison, attest that this documentation has been prepared under the direction and in the presence of Rolland PorterMark Lyric Rossano, MD . Electronically Signed: Teofilo PodMatthew P. Jamison, ED Scribe. 05/10/2016. 4:31 PM.  History   Chief Complaint Chief Complaint  Patient presents with  . Fatigue   The history is provided by the patient. No language interpreter was used.   HPI Comments:  Ike Benedriel Q Rahmani is a 30 y.o. male who presents to the Emergency Department complaining of intermittent general fatigue and weakness x 2 days. Pt states that this feeling comes and goes, and describes it as a lack of energy. Pt complains of associated nausea and generalized body aches. Pt denies any history of cardiac problems, and states that he has been eating and drinking normally. No alleviating factors noted. Pt denies chest pain, SOB.   Past Medical History:  Diagnosis Date  . Hemorrhoids     Patient Active Problem List   Diagnosis Date Noted  . DENTAL CARIES 11/05/2008  . ALLERGIC RHINITIS 09/21/2008  . ASTHMA 09/21/2008    History reviewed. No pertinent surgical history.     Home Medications    Prior to Admission medications   Medication Sig Start Date End Date Taking? Authorizing Provider  cyclobenzaprine (FLEXERIL) 10 MG tablet Take 10 mg by mouth 3 (three) times daily as needed for muscle spasms.    Historical Provider, MD  gabapentin (NEURONTIN) 100 MG capsule Take 1 capsule (100 mg total) by mouth 3 (three) times daily. 04/19/15   Tiffany Netta CedarsS Noel, PA-C  ibuprofen (ADVIL,MOTRIN) 200 MG tablet Take 200-800 mg by mouth every 6 (six) hours as needed. pain     Historical Provider, MD  naproxen (NAPROSYN) 500 MG tablet Take 1 tablet (500 mg total) by mouth 2 (two) times daily as needed for mild pain or moderate pain (TAKE WITH MEALS.). 04/19/15   Vivianne Masteriffany S Noel, PA-C    terbinafine (LAMISIL) 1 % cream Apply 1 application topically daily. 04/19/15   Vivianne Masteriffany S Noel, PA-C    Family History No family history on file.  Social History Social History  Substance Use Topics  . Smoking status: Current Every Day Smoker    Packs/day: 0.50  . Smokeless tobacco: Never Used  . Alcohol use Yes     Comment: occasionally      Allergies   Patient has no known allergies.   Review of Systems Review of Systems  Constitutional: Positive for fatigue. Negative for appetite change, chills, diaphoresis and fever.  HENT: Negative for mouth sores, sore throat and trouble swallowing.   Eyes: Negative for visual disturbance.  Respiratory: Negative for cough, chest tightness, shortness of breath and wheezing.   Cardiovascular: Negative for chest pain.  Gastrointestinal: Positive for nausea. Negative for abdominal distention, abdominal pain, diarrhea and vomiting.  Endocrine: Negative for polydipsia, polyphagia and polyuria.  Genitourinary: Negative for dysuria, frequency and hematuria.  Musculoskeletal: Positive for myalgias. Negative for gait problem.  Skin: Negative for color change, pallor and rash.  Neurological: Positive for weakness. Negative for dizziness, syncope, light-headedness and headaches.  Hematological: Does not bruise/bleed easily.  Psychiatric/Behavioral: Negative for behavioral problems and confusion.     Physical Exam Updated Vital Signs BP 156/85 (BP Location: Left Arm)   Pulse 79   Temp 98.5 F (36.9 C) (Oral)   Resp 14   Ht 5\' 6"  (1.676 m)  Wt 220 lb (99.8 kg)   SpO2 96%   BMI 35.51 kg/m   Physical Exam  Constitutional: He is oriented to person, place, and time. He appears well-developed and well-nourished. No distress.  HENT:  Head: Normocephalic.  Eyes: Conjunctivae are normal. Pupils are equal, round, and reactive to light. No scleral icterus.  Neck: Normal range of motion. Neck supple. No thyromegaly present.  Cardiovascular:  Normal rate and regular rhythm.  Exam reveals no gallop and no friction rub.   No murmur heard. Pulmonary/Chest: Effort normal and breath sounds normal. No respiratory distress. He has no wheezes. He has no rales.  Abdominal: Soft. Bowel sounds are normal. He exhibits no distension. There is no tenderness. There is no rebound.  Musculoskeletal: Normal range of motion.  Neurological: He is alert and oriented to person, place, and time.  Skin: Skin is warm and dry. No rash noted.  Psychiatric: He has a normal mood and affect. His behavior is normal.     ED Treatments / Results  DIAGNOSTIC STUDIES:  Oxygen Saturation is 96% on RA, normal by my interpretation.    COORDINATION OF CARE:  4:31 PM Discussed treatment plan with pt at bedside and pt agreed to plan.   Labs (all labs ordered are listed, but only abnormal results are displayed) Labs Reviewed  BASIC METABOLIC PANEL - Abnormal; Notable for the following:       Result Value   CO2 21 (*)    Glucose, Bld 113 (*)    All other components within normal limits  CBC - Abnormal; Notable for the following:    Hemoglobin 17.2 (*)    All other components within normal limits  URINALYSIS, ROUTINE W REFLEX MICROSCOPIC  CBG MONITORING, ED    EKG  EKG Interpretation  Date/Time:  Thursday May 10 2016 14:51:30 EST Ventricular Rate:  86 PR Interval:  144 QRS Duration: 80 QT Interval:  348 QTC Calculation: 416 R Axis:   79 Text Interpretation:  Normal sinus rhythm Nonspecific T wave abnormality Abnormal ECG Confirmed by Fayrene FearingJAMES  MD, Frieda Arnall ([id number]) on 05/10/2016 4:30:30 PM       Radiology No results found.  Procedures Procedures (including critical care time)  Medications Ordered in ED Medications - No data to display   Initial Impression / Assessment and Plan / ED Course  I have reviewed the triage vital signs and the nursing notes.  Pertinent labs & imaging results that were available during my care of the patient were  reviewed by me and considered in my medical decision making (see chart for details).  Clinical Course     Low-risk patient. Otherwise healthy. Normal exam. Benign symptoms. Reassured. Discharged. Follow-up with changes.  Final Clinical Impressions(s) / ED Diagnoses   Final diagnoses:  Fatigue, unspecified type    New Prescriptions New Prescriptions   No medications on file      Rolland PorterMark Bayler Gehrig, MD 05/10/16 1640

## 2016-10-24 ENCOUNTER — Emergency Department (HOSPITAL_COMMUNITY)
Admission: EM | Admit: 2016-10-24 | Discharge: 2016-10-24 | Disposition: A | Discharge status: Home / Self Care | Payer: BLUE CROSS/BLUE SHIELD | Attending: Emergency Medicine | Admitting: Emergency Medicine

## 2016-10-24 ENCOUNTER — Encounter (HOSPITAL_COMMUNITY): Payer: Self-pay

## 2016-10-24 DIAGNOSIS — F17200 Nicotine dependence, unspecified, uncomplicated: Secondary | ICD-10-CM | POA: Insufficient documentation

## 2016-10-24 DIAGNOSIS — S31119A Laceration without foreign body of abdominal wall, unspecified quadrant without penetration into peritoneal cavity, initial encounter: Secondary | ICD-10-CM | POA: Insufficient documentation

## 2016-10-24 DIAGNOSIS — J45909 Unspecified asthma, uncomplicated: Secondary | ICD-10-CM | POA: Insufficient documentation

## 2016-10-24 DIAGNOSIS — Y999 Unspecified external cause status: Secondary | ICD-10-CM | POA: Insufficient documentation

## 2016-10-24 DIAGNOSIS — W25XXXA Contact with sharp glass, initial encounter: Secondary | ICD-10-CM | POA: Insufficient documentation

## 2016-10-24 DIAGNOSIS — Y9389 Activity, other specified: Secondary | ICD-10-CM | POA: Insufficient documentation

## 2016-10-24 DIAGNOSIS — Y929 Unspecified place or not applicable: Secondary | ICD-10-CM | POA: Insufficient documentation

## 2016-10-24 MED ORDER — LIDOCAINE-EPINEPHRINE (PF) 1 %-1:200000 IJ SOLN
10.0000 mL | Freq: Once | INTRAMUSCULAR | Status: AC
  Administered 2016-10-24: 10 mL
  Filled 2016-10-24: qty 10
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
## 2016-11-08 ENCOUNTER — Emergency Department (HOSPITAL_COMMUNITY)
Admission: EM | Admit: 2016-11-08 | Discharge: 2016-11-08 | Disposition: A | Discharge status: Home / Self Care | Payer: BLUE CROSS/BLUE SHIELD | Attending: Emergency Medicine | Admitting: Emergency Medicine

## 2016-11-08 ENCOUNTER — Encounter (HOSPITAL_COMMUNITY): Payer: Self-pay | Admitting: *Deleted

## 2016-11-08 DIAGNOSIS — Z79899 Other long term (current) drug therapy: Secondary | ICD-10-CM | POA: Insufficient documentation

## 2016-11-08 DIAGNOSIS — F17200 Nicotine dependence, unspecified, uncomplicated: Secondary | ICD-10-CM | POA: Insufficient documentation

## 2016-11-08 DIAGNOSIS — Z4802 Encounter for removal of sutures: Secondary | ICD-10-CM | POA: Insufficient documentation

## 2016-11-08 DIAGNOSIS — J45909 Unspecified asthma, uncomplicated: Secondary | ICD-10-CM | POA: Insufficient documentation

## 2016-11-08 NOTE — ED Triage Notes (Signed)
Pt needing sutures removed from L side. Sutures placed more than 5 days ago. Areas appear to be healing appropriately
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
## 2017-03-12 ENCOUNTER — Encounter (HOSPITAL_COMMUNITY): Payer: Self-pay | Admitting: Emergency Medicine

## 2017-03-12 ENCOUNTER — Emergency Department (HOSPITAL_COMMUNITY)
Admission: EM | Admit: 2017-03-12 | Discharge: 2017-03-12 | Disposition: A | Discharge status: Home / Self Care | Payer: BLUE CROSS/BLUE SHIELD | Attending: Emergency Medicine | Admitting: Emergency Medicine

## 2017-03-12 DIAGNOSIS — X58XXXA Exposure to other specified factors, initial encounter: Secondary | ICD-10-CM | POA: Insufficient documentation

## 2017-03-12 DIAGNOSIS — F17210 Nicotine dependence, cigarettes, uncomplicated: Secondary | ICD-10-CM | POA: Insufficient documentation

## 2017-03-12 DIAGNOSIS — Y929 Unspecified place or not applicable: Secondary | ICD-10-CM | POA: Insufficient documentation

## 2017-03-12 DIAGNOSIS — Z79899 Other long term (current) drug therapy: Secondary | ICD-10-CM | POA: Insufficient documentation

## 2017-03-12 DIAGNOSIS — J45909 Unspecified asthma, uncomplicated: Secondary | ICD-10-CM | POA: Insufficient documentation

## 2017-03-12 DIAGNOSIS — Y999 Unspecified external cause status: Secondary | ICD-10-CM | POA: Insufficient documentation

## 2017-03-12 DIAGNOSIS — Y939 Activity, unspecified: Secondary | ICD-10-CM | POA: Insufficient documentation

## 2017-03-12 DIAGNOSIS — S0502XA Injury of conjunctiva and corneal abrasion without foreign body, left eye, initial encounter: Secondary | ICD-10-CM | POA: Insufficient documentation

## 2017-03-12 DIAGNOSIS — H1032 Unspecified acute conjunctivitis, left eye: Secondary | ICD-10-CM | POA: Insufficient documentation

## 2017-03-12 MED ORDER — IBUPROFEN 400 MG PO TABS
600.0000 mg | ORAL_TABLET | Freq: Once | ORAL | Status: AC
  Administered 2017-03-12: 600 mg via ORAL
  Filled 2017-03-12: qty 1

## 2017-03-12 MED ORDER — TETRACAINE HCL 0.5 % OP SOLN
1.0000 [drp] | Freq: Once | OPHTHALMIC | Status: AC
  Administered 2017-03-12: 1 [drp] via OPHTHALMIC
  Filled 2017-03-12: qty 4

## 2017-03-12 MED ORDER — POLYMYXIN B-TRIMETHOPRIM 10000-0.1 UNIT/ML-% OP SOLN
1.0000 [drp] | OPHTHALMIC | Status: DC
  Administered 2017-03-12: 1 [drp] via OPHTHALMIC
  Filled 2017-03-12: qty 10

## 2017-03-12 MED ORDER — FLUORESCEIN SODIUM 1 MG OP STRP
ORAL_STRIP | OPHTHALMIC | Status: AC
Start: 2017-03-12 — End: 2017-03-12
  Administered 2017-03-12: 1
  Filled 2017-03-12: qty 1

## 2017-03-12 NOTE — Discharge Instructions (Signed)
Please read and follow all provided instructions.  Your diagnoses today include:  1. Acute conjunctivitis of left eye, unspecified acute conjunctivitis type   2. Abrasion of left cornea, initial encounter     Tests performed today include: Visual acuity testing to check your vision Fluorescein dye examination to look for scratches on your eye Tonometry to check the pressure inside of your eye Vital signs. See below for your results today.   Medications prescribed:   Take any prescribed medications only as directed.  Trimethroprim-Polymyxin - antibiotic eye drops or eye ointment  Use this medication as follows: Use 1-2 drops in affected eye every 4 hours while awake for 5 days.   Home care instructions:  Follow any educational materials contained in this packet. If you wear contact lenses, do not use them until your eye caregiver approves. Follow-up care is necessary to be sure the infection is healing if not completely resolved in 2-3 days. See your caregiver or eye specialist as suggested for followup.   If you have an eye infection, wash your hands often as this is very contagious and is easily spread from person to person.   Follow-up instructions: Please follow-up with your primary care doctor OR the opthalmologist listed in the next 2-3 days for further evaluation of your symptoms.  Return instructions:  Please return to the Emergency Department if you experience worsening symptoms.  Please return immediately if you develop severe pain, pus drainage, new change in vision, or fever. Please return if you have any other emergent concerns.  Additional Information:  Your vital signs today were: BP (!) 154/88 (BP Location: Left Arm)    Pulse 76    Temp 98.1 F (36.7 C) (Oral)    Resp 18    Ht 5\' 7"  (1.702 m)    Wt 99.8 kg (220 lb)    SpO2 99%    BMI 34.46 kg/m  If your blood pressure (BP) was elevated above 135/85 this visit, please have this repeated by your doctor within one  month. ---------------

## 2017-03-12 NOTE — ED Provider Notes (Signed)
[facility] EMERGENCY DEPARTMENT Provider Note   CSN: [id number] Arrival date & time: 03/12/17  0704     History   Chief Complaint Chief Complaint  Patient presents with  . Conjunctivitis    HPI Beckhem Q Whitby is a 31 y.o. male.  HPI   Patient is a 31 year old male with a history of allergic rhinitis and asthma presenting for 1 day history of left eye pain and copious green and yellow drainage.  Patient reports he has felt he had pain in the upper eye and possible foreign body sensation in that area.  Patient does not recall a specific incident of injury to the eye, and has had no eye trauma.  Patient woke up with diffuse crusting of the eye.  Patient reports he has had some blurry vision, but no double vision or loss of vision.  Patient has had an upper respiratory infection preceding the symptoms.  Patient is significantly congested.  Patient denies any retro-orbital pain, retro-orbital pain with extraocular movements, fever, chills, headache.  Patient denies any history of autoimmune conditions or immuno suppressing conditions or medications. Patient is not a contact lens wearer.  Past Medical History:  Diagnosis Date  . Hemorrhoids     Patient Active Problem List   Diagnosis Date Noted  . DENTAL CARIES 11/05/2008  . ALLERGIC RHINITIS 09/21/2008  . ASTHMA 09/21/2008    History reviewed. No pertinent surgical history.     Home Medications    Prior to Admission medications   Medication Sig Start Date End Date Taking? Authorizing Provider  albuterol (PROVENTIL HFA;VENTOLIN HFA) 108 (90 Base) MCG/ACT inhaler Inhale 2 puffs into the lungs every 6 (six) hours as needed for wheezing or shortness of breath.    [provider]  gabapentin (NEURONTIN) 100 MG capsule Take 1 capsule (100 mg total) by mouth 3 (three) times daily. Patient not taking: Reported on 10/24/2016 04/19/15   Vivianne Master, PA-C  naproxen (NAPROSYN) 500 MG tablet Take 1  tablet (500 mg total) by mouth 2 (two) times daily as needed for mild pain or moderate pain (TAKE WITH MEALS.). Patient not taking: Reported on 10/24/2016 04/19/15   Vivianne Master, PA-C    Family History No family history on file.  Social History Social History  Substance Use Topics  . Smoking status: Current Every Day Smoker    Packs/day: 0.50  . Smokeless tobacco: Never Used  . Alcohol use Yes     Comment: occasionally      Allergies   Patient has no known allergies.   Review of Systems Review of Systems  Constitutional: Negative for chills and fever.  HENT: Positive for congestion, rhinorrhea, sinus pain and sinus pressure.   Eyes: Positive for pain, discharge, redness, itching and visual disturbance.  Gastrointestinal: Negative for nausea and vomiting.     Physical Exam Updated Vital Signs BP (!) 160/90 (BP Location: Right Arm)   Pulse 85   Temp 98.1 F (36.7 C) (Oral)   Resp 16   Ht 5\' 7"  (1.702 m)   Wt 99.8 kg (220 lb)   SpO2 100%   BMI 34.46 kg/m   Physical Exam  Constitutional: He appears well-developed and well-nourished. No distress.  Sitting comfortably in bed.  HENT:  Head: Normocephalic and atraumatic.  Mouth/Throat: Oropharynx is clear and moist. No oropharyngeal exudate.  Eyes: Pupils are equal, round, and reactive to light. EOM are normal. Right eye exhibits no discharge. Left eye exhibits discharge.  No proptosis, no  periorbital edema. Extraocular movements intact and no pain with extraocular movements.  Copious purulent drainage out of the left eye. On fluorescein exam, oval-shaped uptake of the cornea overlying the pupil. This was not visible without fluorescein.  Neck: Normal range of motion. Neck supple.  Cardiovascular: Normal rate and regular rhythm.   Intact, 2+ radial pulse.  Pulmonary/Chest:  Normal respiratory effort. Patient converses comfortably. No audible wheeze or stridor.  Abdominal: He exhibits no distension.    Musculoskeletal: Normal range of motion.  Lymphadenopathy:    He has no cervical adenopathy.  Neurological: He is alert.  Cranial nerves intact to gross observation. Patient moves extremities without difficulty.  Skin: Skin is warm and dry. He is not diaphoretic.  Psychiatric: He has a normal mood and affect. His behavior is normal. Judgment and thought content normal.  Nursing note and vitals reviewed.     Visual Acuity  Right Eye Distance: 20/20 without corrective lens Left Eye Distance: 20/20 without corrective lens Bilateral Distance: 20/20 without corrective lens  Right Eye Near:   Left Eye Near:    Bilateral Near:    ED Treatments / Results  Labs (all labs ordered are listed, but only abnormal results are displayed) Labs Reviewed - No data to display  EKG  EKG Interpretation None       Radiology No results found.  Procedures Procedures (including critical care time)  Medications Ordered in ED Medications  ibuprofen (ADVIL,MOTRIN) tablet 600 mg (600 mg Oral Given 03/12/17 1007)  tetracaine (PONTOCAINE) 0.5 % ophthalmic solution 1 drop (1 drop Left Eye Given by Other 03/12/17 1007)  fluorescein 1 MG ophthalmic strip (1 strip  Given by Other 03/12/17 1007)     Initial Impression / Assessment and Plan / ED Course  I have reviewed the triage vital signs and the nursing notes.  Pertinent labs & imaging results that were available during my care of the patient were reviewed by me and considered in my medical decision making (see chart for details).     Final Clinical Impressions(s) / ED Diagnoses   Final diagnoses:  None   31 year old male demonstrating likely bacterial conjunctivitis with possible corneal abrasion and affected left eye. This was evaluated with Dr. Pricilla LovelessScott Goldston. Due to concerning corneal uptake, will consult ophthalmology for possible outpatient referral.    Exam and history not consistent with site threatening condition such as acute  angle glaucoma, herpes keratitis, iritis, and there is no proptosis or periorbital edema.  Spoke with Dr. Allena KatzPatel regarding patient case, who suggested that patient can follow-up in 24 hours outpatient.  In the meantime, patient discharged on polymyxin B-trimethoprim drops.  Pain adequately controlled with ibuprofen.  This is a shared visit with Dr. Pricilla LovelessScott Goldston. Patient was independently evaluated by this attending physician. Attending physician consulted in evaluation and discharge management.  New Prescriptions New Prescriptions   No medications on file       Delia ChimesMurray, Reyne Falconi B, PA-C 03/12/17 1901    Pricilla LovelessGoldston, Scott, MD 03/13/17 [phone]

## 2017-03-12 NOTE — ED Triage Notes (Signed)
Pt woke up yesterday with left eye red and having drainage.

## 2017-03-12 NOTE — ED Notes (Signed)
Pharmacy verified of unverified med  

## 2017-03-13 ENCOUNTER — Ambulatory Visit: Payer: BLUE CROSS/BLUE SHIELD

## 2017-10-01 ENCOUNTER — Ambulatory Visit: Payer: Self-pay

## 2017-10-01 ENCOUNTER — Other Ambulatory Visit: Payer: Self-pay | Admitting: Occupational Medicine

## 2017-10-01 DIAGNOSIS — M79642 Pain in left hand: Secondary | ICD-10-CM

## 2019-03-25 ENCOUNTER — Ambulatory Visit (HOSPITAL_COMMUNITY)
Admission: EM | Admit: 2019-03-25 | Discharge: 2019-03-25 | Disposition: A | Discharge status: Home / Self Care | Payer: BC Managed Care – PPO | Attending: Family Medicine | Admitting: Family Medicine

## 2019-03-25 ENCOUNTER — Encounter (HOSPITAL_COMMUNITY): Payer: Self-pay

## 2019-03-25 ENCOUNTER — Other Ambulatory Visit: Payer: Self-pay

## 2019-03-25 DIAGNOSIS — B349 Viral infection, unspecified: Secondary | ICD-10-CM

## 2019-03-25 DIAGNOSIS — Z20828 Contact with and (suspected) exposure to other viral communicable diseases: Secondary | ICD-10-CM | POA: Diagnosis not present

## 2019-03-25 DIAGNOSIS — I10 Essential (primary) hypertension: Secondary | ICD-10-CM

## 2019-03-25 HISTORY — DX: Essential (primary) hypertension: I10

## 2019-03-25 NOTE — ED Provider Notes (Signed)
[facility]    CSN: [id number] Arrival date & time: 03/25/19  1742      History   Chief Complaint Chief Complaint  Patient presents with  . Facial Pain    HPI Clayton Rodgers is a 33 y.o. male.   HPI  Patient is here requesting a Covid test.  He states that he went to work yesterday and started feeling sick.  By the time he got home he was having runny stuffy nose, sore throat, sinus pressure, ear pain, and a slight cough.  No fever or chills.  Some tiredness, not severe.  No body aches.  No change in taste, smell, or appetite.  No nausea vomiting or diarrhea.  No known exposure to Covid.  He is worried about small children at home.  Past Medical History:  Diagnosis Date  . Hemorrhoids   . Hypertension     Patient Active Problem List   Diagnosis Date Noted  . DENTAL CARIES 11/05/2008  . ALLERGIC RHINITIS 09/21/2008  . ASTHMA 09/21/2008    History reviewed. No pertinent surgical history.     Home Medications    Prior to Admission medications   Medication Sig Start Date End Date Taking? Authorizing Provider  albuterol (PROVENTIL HFA;VENTOLIN HFA) 108 (90 Base) MCG/ACT inhaler Inhale 2 puffs into the lungs every 6 (six) hours as needed for wheezing or shortness of breath.    [provider]  gabapentin (NEURONTIN) 100 MG capsule Take 1 capsule (100 mg total) by mouth 3 (three) times daily. Patient not taking: Reported on 10/24/2016 04/19/15 03/25/19  Brayton Caves, PA-C    Family History Family History  Problem Relation Age of Onset  . Diabetes Mother   . Hypertension Father     Social History Social History   Tobacco Use  . Smoking status: Current Every Day Smoker    Packs/day: 0.50  . Smokeless tobacco: Never Used  Substance Use Topics  . Alcohol use: Yes    Comment: occasionally   . Drug use: No     Allergies   Patient has no known allergies.   Review of Systems Review of Systems  Constitutional: Positive for fatigue.  Negative for chills and fever.  HENT: Positive for congestion, ear pain, postnasal drip, sinus pressure, sinus pain and sore throat.   Eyes: Negative for pain and visual disturbance.  Respiratory: Positive for cough. Negative for chest tightness and shortness of breath.   Cardiovascular: Negative for chest pain and palpitations.  Gastrointestinal: Negative for abdominal pain and vomiting.  Genitourinary: Negative for dysuria and hematuria.  Musculoskeletal: Negative for arthralgias and back pain.  Skin: Negative for color change and rash.  Neurological: Negative for seizures, syncope and headaches.  All other systems reviewed and are negative.    Physical Exam Triage Vital Signs ED Triage Vitals  Enc Vitals Group     BP 03/25/19 1817 (!) 147/83     Pulse Rate 03/25/19 1817 86     Resp 03/25/19 1817 18     Temp 03/25/19 1817 98.7 F (37.1 C)     Temp Source 03/25/19 1817 Oral     SpO2 03/25/19 1817 100 %     Weight 03/25/19 1815 240 lb (108.9 kg)     Height --      Head Circumference --      Peak Flow --      Pain Score 03/25/19 1815 2     Pain Loc --      Pain  Edu? --      Excl. in GC? --    No data found.  Updated Vital Signs BP (!) 147/83 (BP Location: Right Arm)   Pulse 86   Temp 98.7 F (37.1 C) (Oral)   Resp 18   Wt 108.9 kg   SpO2 100%   BMI 37.59 kg/m      Physical Exam Constitutional:      General: He is not in acute distress.    Appearance: He is well-developed. He is obese.  HENT:     Head: Normocephalic and atraumatic.     Right Ear: Tympanic membrane, ear canal and external ear normal.     Left Ear: Tympanic membrane, ear canal and external ear normal.     Nose: Nose normal. No congestion.     Mouth/Throat:     Mouth: Mucous membranes are moist.     Pharynx: Posterior oropharyngeal erythema present.     Comments: Slight tonsillar swelling and erythema, no exudate Eyes:     Conjunctiva/sclera: Conjunctivae normal.     Pupils: Pupils are equal,  round, and reactive to light.  Neck:     Musculoskeletal: Normal range of motion.  Cardiovascular:     Rate and Rhythm: Normal rate and regular rhythm.  Pulmonary:     Effort: Pulmonary effort is normal. No respiratory distress.     Breath sounds: Normal breath sounds. No wheezing or rales.  Abdominal:     General: There is no distension.     Palpations: Abdomen is soft.  Musculoskeletal: Normal range of motion.  Lymphadenopathy:     Cervical: No cervical adenopathy.  Skin:    General: Skin is warm and dry.  Neurological:     General: No focal deficit present.     Mental Status: He is alert.  Psychiatric:        Mood and Affect: Mood normal.        Behavior: Behavior normal.    Lung exam is normal  UC Treatments / Results  Labs (all labs ordered are listed, but only abnormal results are displayed) Labs Reviewed  NOVEL CORONAVIRUS, NAA (HOSP ORDER, SEND-OUT TO REF LAB; TAT 18-24 HRS)    EKG   Radiology No results found.  Procedures Procedures (including critical care time)  Medications Ordered in UC Medications - No data to display  Initial Impression / Assessment and Plan / UC Course  I have reviewed the triage vital signs and the nursing notes.  Pertinent labs & imaging results that were available during my care of the patient were reviewed by me and considered in my medical decision making (see chart for details).     Reviewed that with 1 day of symptoms as Covid test may not be positive.  Unknown exposure.  Will have him quarantine until his test results are available. Final Clinical Impressions(s) / UC Diagnoses   Final diagnoses:  Viral illness  Contact with or exposure to viral disease     Discharge Instructions     Get plenty of rest Drink more fluids Take tylenol for pain or fever May take OTC cough and cold medicine Quarantine at home until result is available Call for problems   ED Prescriptions    None     PDMP not reviewed this  encounter.   Eustace Moore, MD 03/25/19 [facility] [place]

## 2019-03-25 NOTE — Discharge Instructions (Addendum)
Get plenty of rest Drink more fluids Take tylenol for pain or fever May take OTC cough and cold medicine Quarantine at home until result is available Call for problems

## 2019-03-25 NOTE — ED Triage Notes (Signed)
Pt states he has sinus drainage , low grade fever and a cough this started yesterday. Pt states she would like a Covid test.

## 2019-03-27 LAB — NOVEL CORONAVIRUS, NAA (HOSP ORDER, SEND-OUT TO REF LAB; TAT 18-24 HRS): SARS-CoV-2, NAA: NOT DETECTED

## 2019-09-23 IMAGING — DX DG HAND COMPLETE 3+V*L*
3 series · 3 of 3 positions shown · non-contrast
Comparison: None

CLINICAL DATA: Crush injury to left hand. Pain at thumb and index
fingers and metacarpal region. Laceration at third metacarpal.

EXAM:
LEFT HAND - COMPLETE 3+ VIEW

[hand pa]
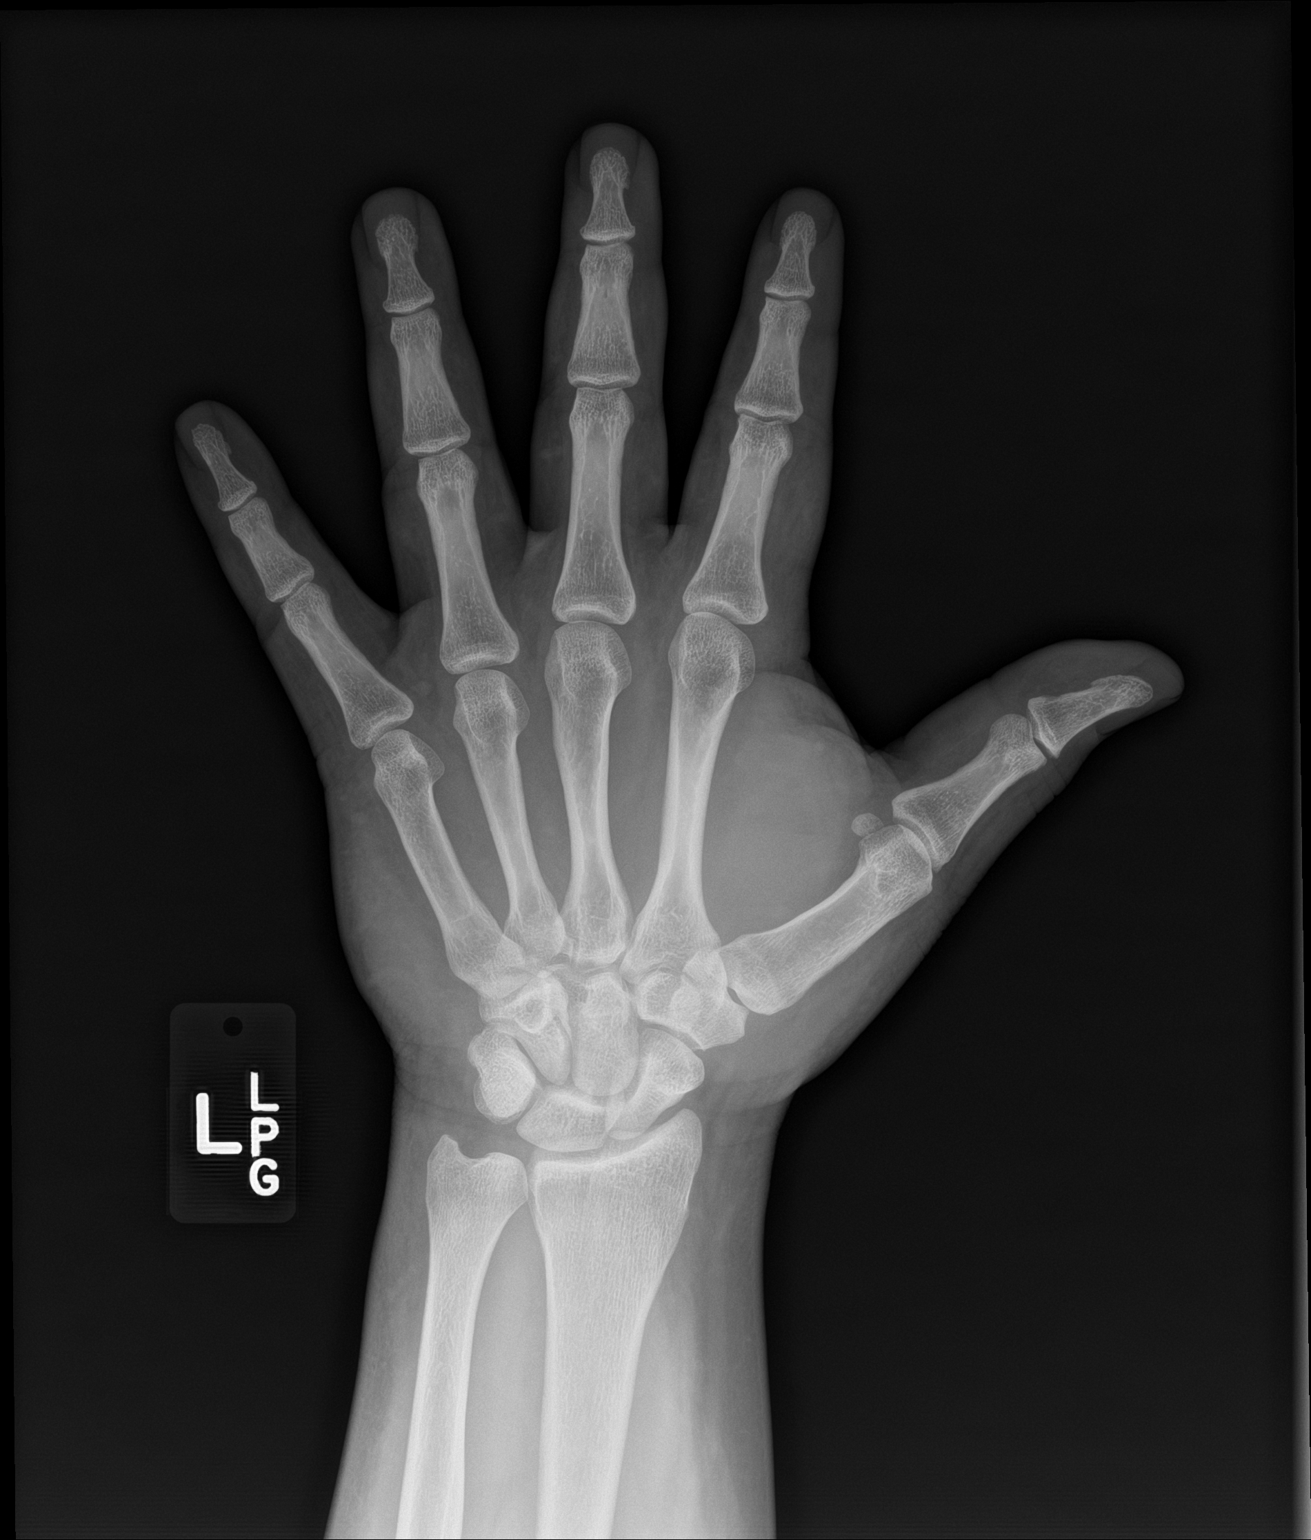

[hand obl]
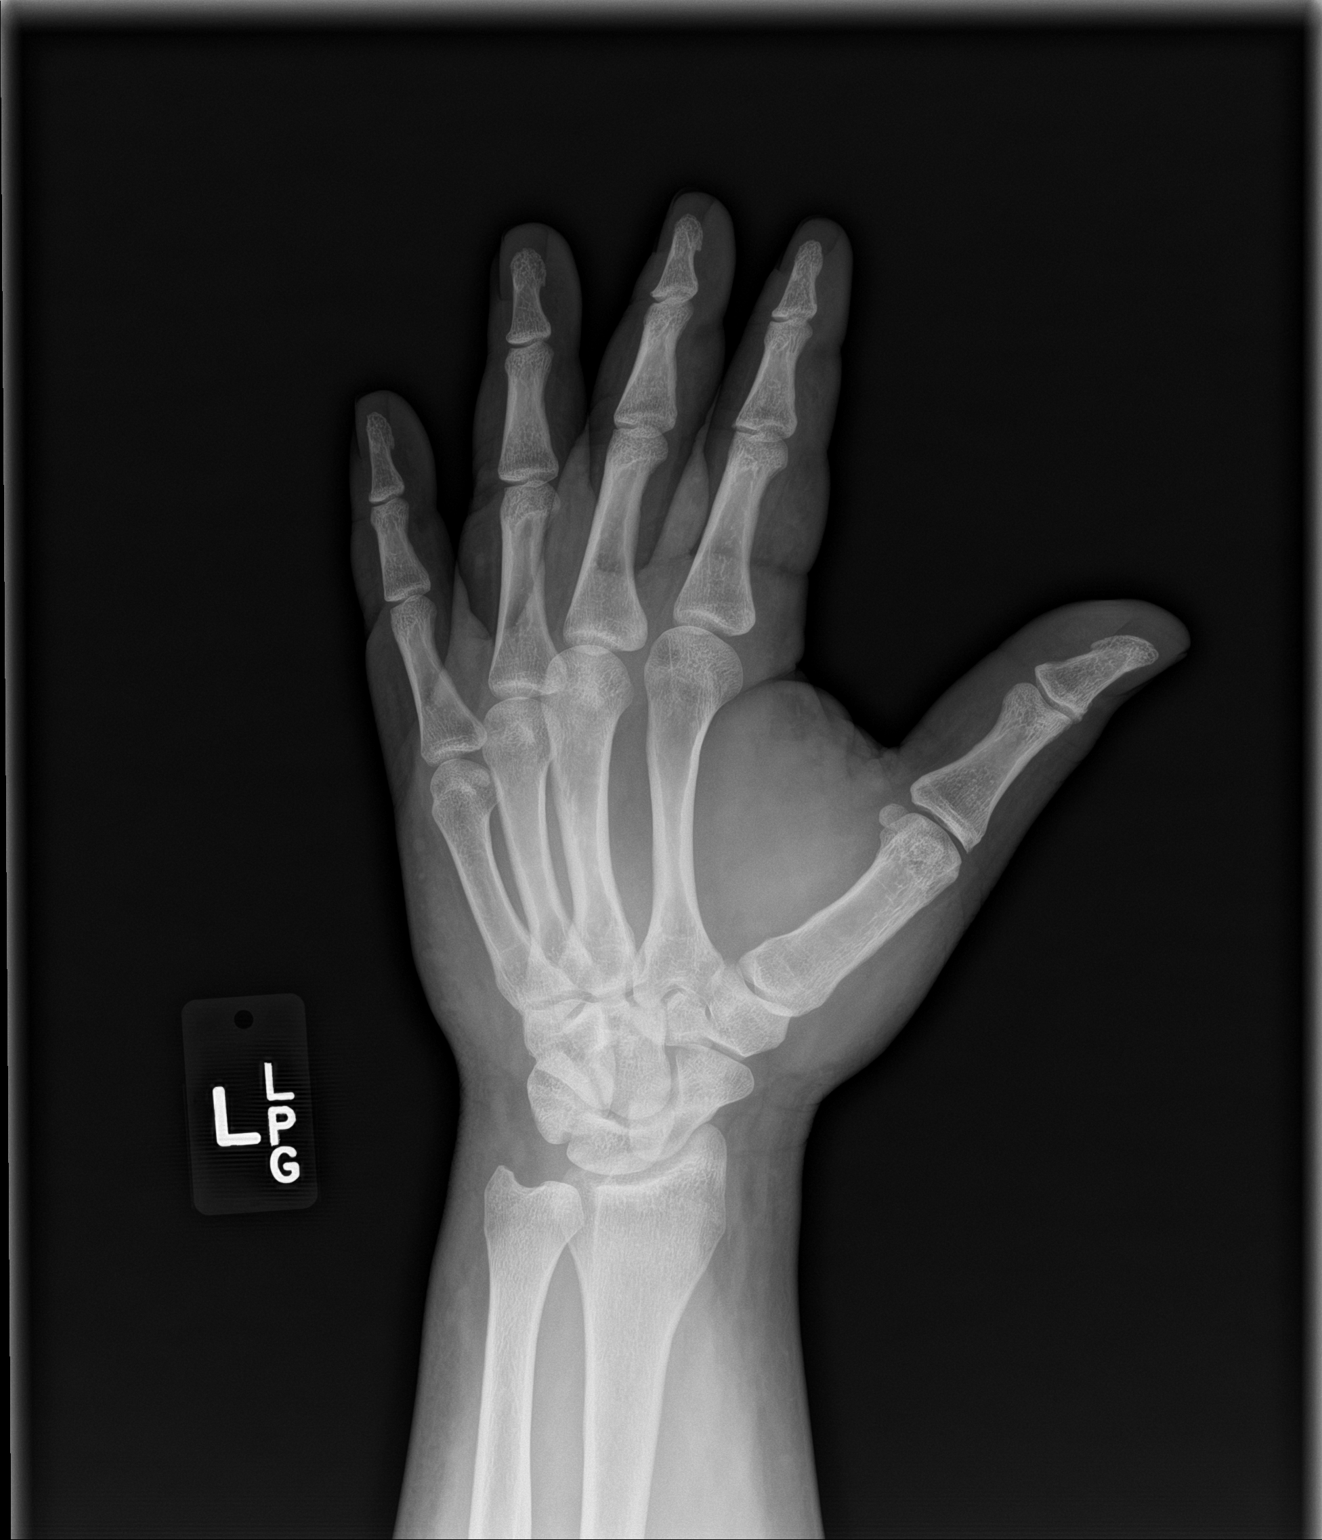

[hand lat]
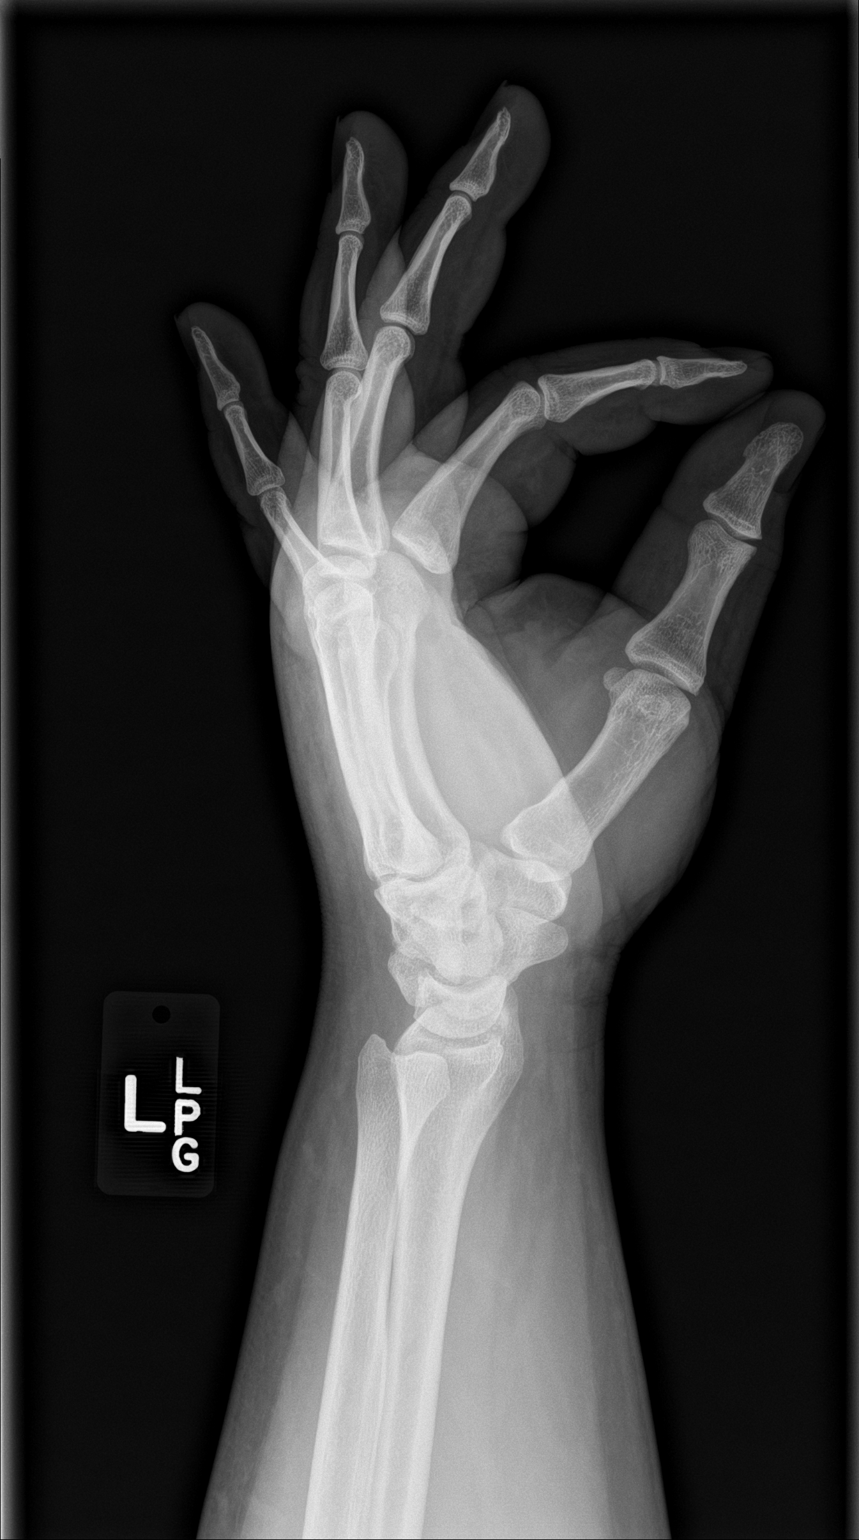

[3 of 3 positions shown; findings below may reference images not displayed]

FINDINGS: Mild diffuse soft tissue swelling. No acute fracture or dislocation
identified. No significant arthropathy.
IMPRESSION: 1. No acute bone abnormality.
2. Soft tissue swelling.
